# Patient Record
Sex: Female | Born: 1980 | Hispanic: Refuse to answer | Marital: Married | Smoking: Never smoker
Health system: Southern US, Community
[De-identification: ages and names within clinical notes are randomized; demographics above are authoritative.]

## PROBLEM LIST (undated history)

## (undated) DIAGNOSIS — F419 Anxiety disorder, unspecified: Secondary | ICD-10-CM

## (undated) DIAGNOSIS — T7840XA Allergy, unspecified, initial encounter: Secondary | ICD-10-CM

## (undated) DIAGNOSIS — K219 Gastro-esophageal reflux disease without esophagitis: Secondary | ICD-10-CM

## (undated) DIAGNOSIS — I10 Essential (primary) hypertension: Secondary | ICD-10-CM

## (undated) HISTORY — PX: NO PAST SURGERIES: SHX2092

## (undated) HISTORY — DX: Allergy, unspecified, initial encounter: T78.40XA

## (undated) HISTORY — DX: Gastro-esophageal reflux disease without esophagitis: K21.9

## (undated) HISTORY — DX: Anxiety disorder, unspecified: F41.9

---

## 2000-04-04 ENCOUNTER — Inpatient Hospital Stay (HOSPITAL_COMMUNITY): Admission: AD | Admit: 2000-04-04 | Discharge: 2000-04-04 | Payer: Self-pay | Admitting: Obstetrics & Gynecology

## 2000-05-29 ENCOUNTER — Other Ambulatory Visit: Admission: RE | Admit: 2000-05-29 | Discharge: 2000-05-29 | Payer: Self-pay | Admitting: *Deleted

## 2003-07-07 ENCOUNTER — Emergency Department (HOSPITAL_COMMUNITY): Admission: EM | Admit: 2003-07-07 | Discharge: 2003-07-08 | Payer: Self-pay | Admitting: Emergency Medicine

## 2005-11-12 ENCOUNTER — Emergency Department (HOSPITAL_COMMUNITY): Admission: EM | Admit: 2005-11-12 | Discharge: 2005-11-12 | Payer: Self-pay | Admitting: *Deleted

## 2009-07-10 ENCOUNTER — Emergency Department (HOSPITAL_COMMUNITY): Admission: EM | Admit: 2009-07-10 | Discharge: 2009-07-10 | Payer: Self-pay | Admitting: Family Medicine

## 2009-12-06 ENCOUNTER — Inpatient Hospital Stay (HOSPITAL_COMMUNITY): Admission: AD | Admit: 2009-12-06 | Discharge: 2009-12-06 | Payer: Self-pay | Admitting: Obstetrics & Gynecology

## 2009-12-06 ENCOUNTER — Ambulatory Visit: Payer: Self-pay | Admitting: Nurse Practitioner

## 2010-05-25 LAB — URINALYSIS, ROUTINE W REFLEX MICROSCOPIC
Glucose, UA: NEGATIVE mg/dL
Ketones, ur: NEGATIVE mg/dL
Nitrite: NEGATIVE
Protein, ur: NEGATIVE mg/dL
Urobilinogen, UA: 4 mg/dL — ABNORMAL HIGH (ref 0.0–1.0)

## 2010-05-25 LAB — POCT PREGNANCY, URINE: Preg Test, Ur: NEGATIVE

## 2011-05-21 ENCOUNTER — Encounter (HOSPITAL_COMMUNITY): Payer: Self-pay | Admitting: *Deleted

## 2011-05-21 ENCOUNTER — Emergency Department (HOSPITAL_COMMUNITY)
Admission: EM | Admit: 2011-05-21 | Discharge: 2011-05-21 | Disposition: A | Discharge status: Home Health | Payer: Self-pay | Attending: Emergency Medicine | Admitting: Emergency Medicine

## 2011-05-21 ENCOUNTER — Emergency Department (INDEPENDENT_AMBULATORY_CARE_PROVIDER_SITE_OTHER)
Admission: EM | Admit: 2011-05-21 | Discharge: 2011-05-21 | Disposition: A | Discharge status: Nursing Home (Includes ICF) | Payer: Self-pay | Source: Home / Self Care

## 2011-05-21 DIAGNOSIS — Z0389 Encounter for observation for other suspected diseases and conditions ruled out: Secondary | ICD-10-CM | POA: Insufficient documentation

## 2011-05-21 DIAGNOSIS — R109 Unspecified abdominal pain: Secondary | ICD-10-CM

## 2011-05-21 LAB — POCT URINALYSIS DIP (DEVICE)
Bilirubin Urine: NEGATIVE
Ketones, ur: NEGATIVE mg/dL
Leukocytes, UA: NEGATIVE
Nitrite: NEGATIVE
Protein, ur: NEGATIVE mg/dL
pH: 6 (ref 5.0–8.0)

## 2011-05-21 LAB — POCT PREGNANCY, URINE: Preg Test, Ur: NEGATIVE

## 2011-05-21 NOTE — ED Notes (Signed)
Pt  Reports   Pain  r  Upper  Quadrant    Radiating  aroiund  To  abd  With  Nausea  As  Well    Pt  reorts  Has  Had  Similar  Symptoms  sev  Weeks  Ago  Got  Worse  Today            Pt  Reports   Has  Taken  pepto  Bismol      For the  Symptoms      She  denys  Any  Vomiting/  Diarrhea           denys     Any  Vaginal  Bleeding or  Discharge           Sitting  Upright on  Exam table  In  No  Severe  Distress

## 2011-05-21 NOTE — ED Notes (Signed)
THE PT DID NOT WANT TO WAIT  ANY LONGER

## 2011-05-21 NOTE — ED Notes (Signed)
Pt states that she feels that she is here to help her be set up with a general practitioner.  No distress noted+

## 2011-05-21 NOTE — ED Provider Notes (Signed)
Medical screening examination/treatment/procedure(s) were performed by non-physician practitioner and as supervising physician I was immediately available for consultation/collaboration.  Alen Bleacher, MD 05/21/11 [phone]

## 2011-05-21 NOTE — ED Notes (Signed)
Pt woke up this am with generalized upper abdominal pain which she has felt in the past.  Pt states that she had nausea with this.  Pt states that the pain was intermittent and sharp.  Pt states that she was seen at [facility] for this and sent here for further evaluation.  Pt states that her aunt had similar symptoms and recently passed away from stomach cancer.

## 2011-05-21 NOTE — ED Provider Notes (Signed)
History     CSN: [id number]  Arrival date & time 05/21/11  1156   None     Chief Complaint  Patient presents with  . Abdominal Pain    (Consider location/radiation/quality/duration/timing/severity/associated sxs/prior treatment) HPI Comments: Patient presents today with complaints of upper abdominal pain and nausea that awoke her from sleep this morning. She has had pain episodes of abdominal pain with nausea and vomiting lasting one to 2 days over the last one year. She states earlier this morning the pain was a 6 on a scale of 1-10, it is currently a 3. No fever or diarrhea. She is urinating normally. She has not taken anything for her symptoms. Appetite is decreased. Patient is tearful and states that she is very concerned about cancer. She has had several family members including her mother who have passed away due sarcoma then spread to their abdomen. Most recently was an aunt who passed away 3 weeks ago. Patient is uninsured and has no primary care physician. She has not had these symptoms evaluated previously.   History reviewed. No pertinent past medical history.  History reviewed. No pertinent past surgical history.  History reviewed. No pertinent family history.  History  Substance Use Topics  . Smoking status: Not on file  . Smokeless tobacco: Not on file  . Alcohol Use: Not on file    OB History    Grav Para Term Preterm Abortions TAB SAB Ect Mult Living                  Review of Systems  Constitutional: Negative for fever and chills.  Respiratory: Negative for cough and shortness of breath.   Cardiovascular: Negative for chest pain.  Gastrointestinal: Positive for nausea, vomiting, abdominal pain and constipation (mild constipation). Negative for diarrhea.  Genitourinary: Negative for dysuria, frequency and decreased urine volume.    Allergies  Dust mite extract  Home Medications  No current outpatient prescriptions on file.  BP 172/100  Pulse 95   Temp(Src) 98.2 F (36.8 C) (Oral)  Resp 18  SpO2 100%  LMP 05/07/2011  Physical Exam  Constitutional: She appears well-developed and well-nourished. No distress.  HENT:  Head: Normocephalic and atraumatic.  Right Ear: Tympanic membrane, external ear and ear canal normal.  Left Ear: Tympanic membrane, external ear and ear canal normal.  Nose: Nose normal.  Mouth/Throat: Uvula is midline, oropharynx is clear and moist and mucous membranes are normal. No oropharyngeal exudate, posterior oropharyngeal edema or posterior oropharyngeal erythema.  Neck: Neck supple.  Cardiovascular: Normal rate, regular rhythm and normal heart sounds.   Pulmonary/Chest: Effort normal and breath sounds normal. No respiratory distress.  Abdominal: Soft. Normal appearance and bowel sounds are normal. She exhibits no mass. There is no hepatosplenomegaly. There is tenderness in the right upper quadrant and left upper quadrant. There is no rebound and no guarding.  Lymphadenopathy:    She has no cervical adenopathy.  Neurological: She is alert.  Skin: Skin is warm and dry.  Psychiatric:       Tearful.    ED Course  Procedures (including critical care time)   Labs Reviewed  POCT URINALYSIS DIP (DEVICE)  POCT PREGNANCY, URINE   No results found.   1. Abdominal pain       MDM  Patient transferred to the ED for further evaluation.        Melody Comas, [place] 05/21/11 1439

## 2012-04-02 ENCOUNTER — Emergency Department (HOSPITAL_COMMUNITY)
Admission: EM | Admit: 2012-04-02 | Discharge: 2012-04-03 | Disposition: A | Discharge status: Home / Self Care | Payer: Self-pay | Attending: Emergency Medicine | Admitting: Emergency Medicine

## 2012-04-02 DIAGNOSIS — G43119 Migraine with aura, intractable, without status migrainosus: Secondary | ICD-10-CM | POA: Insufficient documentation

## 2012-04-02 DIAGNOSIS — H53149 Visual discomfort, unspecified: Secondary | ICD-10-CM | POA: Insufficient documentation

## 2012-04-02 DIAGNOSIS — G43109 Migraine with aura, not intractable, without status migrainosus: Secondary | ICD-10-CM

## 2012-04-02 DIAGNOSIS — R42 Dizziness and giddiness: Secondary | ICD-10-CM | POA: Insufficient documentation

## 2012-04-02 NOTE — ED Notes (Signed)
Pt states that she has had intermittent eye pain over the last year and pressure in her R eye. States that the eye is sensitive to light. States that it happens about 1-2x a month and lasts for about a day.

## 2012-04-03 MED ORDER — TETRACAINE HCL 0.5 % OP SOLN
OPHTHALMIC | Status: AC
  Administered 2012-04-03: 2 [drp] via OPHTHALMIC
  Filled 2012-04-03: qty 2

## 2012-04-03 MED ORDER — TETRACAINE HCL 0.5 % OP SOLN
1.0000 [drp] | Freq: Once | OPHTHALMIC | Status: AC
  Administered 2012-04-03: 2 [drp] via OPHTHALMIC

## 2012-04-05 NOTE — ED Provider Notes (Signed)
Medical screening examination/treatment/procedure(s) were performed by non-physician practitioner and as supervising physician I was immediately available for consultation/collaboration.   Thamas Appleyard, MD 04/05/12 2356 

## 2012-04-05 NOTE — ED Provider Notes (Signed)
History     CSN: [id number]  Arrival date & time 04/02/12  2215   First MD Initiated Contact with Patient 04/02/12 2323      Chief Complaint  Patient presents with  . Eye Pain  . Pressure Behind the Eyes    (Consider location/radiation/quality/duration/timing/severity/associated sxs/prior treatment) HPI Comments: 32 y.o. female presents today complaining of frequent, gradual onset eye pressure for the last year. Patient rates severity 4/10. This pain is similar to other times she has felt this pain. Course is constant. Frequency is once a month, lasts about a day, then disappears. Interventions include benadryl which provides momentary relief. Pt admits photophobia, visual disturbances (describes seeing floaters), dizziness, and allergies to pet dander (pt owns a dog). Pressure does not radiate.  Denies fever, recent illness, trauma, headache, nausea/vomiting, jaw pain, numbness/tingling.       Patient is a 32 y.o. female presenting with eye pain.  Eye Pain Pertinent negatives include no chest pain, diaphoresis, fever, headaches, nausea, neck pain, numbness, rash, sore throat, vomiting or weakness.    No past medical history on file.  No past surgical history on file.  No family history on file.  History  Substance Use Topics  . Smoking status: Not on file  . Smokeless tobacco: Not on file  . Alcohol Use: No    OB History    Grav Para Term Preterm Abortions TAB SAB Ect Mult Living                  Review of Systems  Constitutional: Negative for fever and diaphoresis.  HENT: Negative for ear pain, sore throat, trouble swallowing, neck pain, neck stiffness, sinus pressure and tinnitus.        No jaw pain or trouble chewing  Eyes: Positive for photophobia, pain and visual disturbance.  Respiratory: Negative for apnea, chest tightness and shortness of breath.   Cardiovascular: Negative for chest pain and palpitations.  Gastrointestinal: Negative for nausea, vomiting,  diarrhea and constipation.  Genitourinary: Negative for dysuria and decreased urine volume.  Musculoskeletal: Negative for gait problem.  Skin: Negative for rash.  Neurological: Positive for dizziness. Negative for weakness, light-headedness, numbness and headaches.    Allergies  Other and Dust mite extract  Home Medications  No current outpatient prescriptions on file.  BP 150/107  Pulse 95  Temp 98.1 F (36.7 C) (Oral)  SpO2 97%  Physical Exam  Nursing note and vitals reviewed. Constitutional: She is oriented to person, place, and time. She appears well-developed and well-nourished. No distress.  HENT:  Head: Normocephalic and atraumatic. Head is without contusion.  Right Ear: Tympanic membrane normal.  Left Ear: Tympanic membrane normal.  Eyes: Conjunctivae normal and EOM are normal. Pupils are equal, round, and reactive to light.       Visual accuity normal using hand held snellen chart. Examined eye using slit lamp. No abnormalities. No hazy cornea. Tonometry readings 11, 14 and 14  Neck: Normal range of motion. Neck supple.       No meningeal signs  Cardiovascular: Normal rate, regular rhythm and normal heart sounds.  Exam reveals no gallop and no friction rub.   No murmur heard. Pulmonary/Chest: Effort normal and breath sounds normal. No respiratory distress. She has no wheezes. She has no rales. She exhibits no tenderness.  Abdominal: Soft. Bowel sounds are normal. She exhibits no distension. There is no tenderness. There is no rebound and no guarding.  Musculoskeletal: Normal range of motion. She exhibits no edema and no  tenderness.  Neurological: She is alert and oriented to person, place, and time. No cranial nerve deficit.  Skin: Skin is warm and dry. She is not diaphoretic. No erythema.    ED Course  Procedures (including critical care time)  Labs Reviewed - No data to display No results found.   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 Pt is afebrile with no focal neuro deficits, nuchal rigidity, or change in vision. Pt is to follow up with PCP to further evaluated if these are migraine auras without headache. Pt verbalizes understanding and is agreeable with plan to dc.   Glade Nurse, PA-C 04/05/12 2207

## 2013-10-23 ENCOUNTER — Encounter (HOSPITAL_COMMUNITY): Payer: Self-pay | Admitting: Emergency Medicine

## 2013-10-23 ENCOUNTER — Emergency Department (HOSPITAL_COMMUNITY)
Admission: EM | Admit: 2013-10-23 | Discharge: 2013-10-23 | Disposition: A | Discharge status: Home / Self Care | Payer: Commercial Managed Care - PPO | Attending: Emergency Medicine | Admitting: Emergency Medicine

## 2013-10-23 ENCOUNTER — Emergency Department (HOSPITAL_COMMUNITY): Payer: Commercial Managed Care - PPO

## 2013-10-23 DIAGNOSIS — S4980XA Other specified injuries of shoulder and upper arm, unspecified arm, initial encounter: Secondary | ICD-10-CM | POA: Diagnosis not present

## 2013-10-23 DIAGNOSIS — S99919A Unspecified injury of unspecified ankle, initial encounter: Principal | ICD-10-CM

## 2013-10-23 DIAGNOSIS — S46909A Unspecified injury of unspecified muscle, fascia and tendon at shoulder and upper arm level, unspecified arm, initial encounter: Secondary | ICD-10-CM | POA: Insufficient documentation

## 2013-10-23 DIAGNOSIS — S99929A Unspecified injury of unspecified foot, initial encounter: Principal | ICD-10-CM

## 2013-10-23 DIAGNOSIS — S8990XA Unspecified injury of unspecified lower leg, initial encounter: Secondary | ICD-10-CM | POA: Insufficient documentation

## 2013-10-23 DIAGNOSIS — Y9289 Other specified places as the place of occurrence of the external cause: Secondary | ICD-10-CM | POA: Diagnosis not present

## 2013-10-23 DIAGNOSIS — M25572 Pain in left ankle and joints of left foot: Secondary | ICD-10-CM

## 2013-10-23 DIAGNOSIS — M25511 Pain in right shoulder: Secondary | ICD-10-CM

## 2013-10-23 DIAGNOSIS — Y9389 Activity, other specified: Secondary | ICD-10-CM | POA: Insufficient documentation

## 2013-10-23 MED ORDER — METHOCARBAMOL 500 MG PO TABS: 500.0000 mg | ORAL_TABLET | Freq: Two times a day (BID) | ORAL | Status: DC

## 2013-10-23 MED ORDER — IBUPROFEN 800 MG PO TABS: 800.0000 mg | ORAL_TABLET | Freq: Three times a day (TID) | ORAL | Status: DC
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
## 2013-10-25 NOTE — ED Provider Notes (Signed)
Medical screening examination/treatment/procedure(s) were performed by non-physician practitioner and as supervising physician I was immediately available for consultation/collaboration.   EKG Interpretation None        Mirian MoMatthew Jep Dyas, MD 10/25/13 [phone]

## 2015-05-25 ENCOUNTER — Other Ambulatory Visit: Payer: Self-pay

## 2015-05-25 DIAGNOSIS — Z1231 Encounter for screening mammogram for malignant neoplasm of breast: Secondary | ICD-10-CM

## 2016-05-30 ENCOUNTER — Emergency Department (HOSPITAL_COMMUNITY): Payer: BLUE CROSS/BLUE SHIELD

## 2016-05-30 ENCOUNTER — Emergency Department (HOSPITAL_COMMUNITY)
Admission: EM | Admit: 2016-05-30 | Discharge: 2016-05-30 | Disposition: A | Discharge status: Home / Self Care | Payer: BLUE CROSS/BLUE SHIELD | Attending: Emergency Medicine | Admitting: Emergency Medicine

## 2016-05-30 ENCOUNTER — Encounter (HOSPITAL_COMMUNITY): Payer: Self-pay | Admitting: Emergency Medicine

## 2016-05-30 DIAGNOSIS — R112 Nausea with vomiting, unspecified: Secondary | ICD-10-CM | POA: Insufficient documentation

## 2016-05-30 DIAGNOSIS — Z79899 Other long term (current) drug therapy: Secondary | ICD-10-CM | POA: Insufficient documentation

## 2016-05-30 LAB — COMPREHENSIVE METABOLIC PANEL
ALT: 11 U/L — AB (ref 14–54)
AST: 17 U/L (ref 15–41)
Albumin: 4.1 g/dL (ref 3.5–5.0)
Alkaline Phosphatase: 80 U/L (ref 38–126)
Anion gap: 6 (ref 5–15)
BUN: 12 mg/dL (ref 6–20)
CHLORIDE: 110 mmol/L (ref 101–111)
CO2: 24 mmol/L (ref 22–32)
Calcium: 9.1 mg/dL (ref 8.9–10.3)
Creatinine, Ser: 0.67 mg/dL (ref 0.44–1.00)
Glucose, Bld: 99 mg/dL (ref 65–99)
POTASSIUM: 4 mmol/L (ref 3.5–5.1)
SODIUM: 140 mmol/L (ref 135–145)
Total Bilirubin: 0.4 mg/dL (ref 0.3–1.2)
Total Protein: 7.8 g/dL (ref 6.5–8.1)

## 2016-05-30 LAB — CBC
HEMATOCRIT: 35.6 % — AB (ref 36.0–46.0)
Hemoglobin: 12 g/dL (ref 12.0–15.0)
MCH: 26.7 pg (ref 26.0–34.0)
MCHC: 33.7 g/dL (ref 30.0–36.0)
MCV: 79.1 fL (ref 78.0–100.0)
Platelets: 465 10*3/uL — ABNORMAL HIGH (ref 150–400)
RBC: 4.5 MIL/uL (ref 3.87–5.11)
RDW: 14.9 % (ref 11.5–15.5)
WBC: 7.3 10*3/uL (ref 4.0–10.5)

## 2016-05-30 LAB — URINALYSIS, ROUTINE W REFLEX MICROSCOPIC
BACTERIA UA: NONE SEEN
BILIRUBIN URINE: NEGATIVE
Glucose, UA: NEGATIVE mg/dL
KETONES UR: NEGATIVE mg/dL
LEUKOCYTES UA: NEGATIVE
Nitrite: NEGATIVE
PH: 6 (ref 5.0–8.0)
PROTEIN: NEGATIVE mg/dL
Specific Gravity, Urine: 1.018 (ref 1.005–1.030)

## 2016-05-30 LAB — I-STAT BETA HCG BLOOD, ED (MC, WL, AP ONLY)

## 2016-05-30 LAB — LIPASE, BLOOD: LIPASE: 20 U/L (ref 11–51)

## 2016-05-30 MED ORDER — ONDANSETRON HCL 4 MG/2ML IJ SOLN
4.0000 mg | Freq: Once | INTRAMUSCULAR | Status: AC
  Administered 2016-05-30: 4 mg via INTRAVENOUS
  Filled 2016-05-30: qty 2

## 2016-05-30 MED ORDER — ONDANSETRON 4 MG PO TBDP: 4.0000 mg | ORAL_TABLET | Freq: Three times a day (TID) | ORAL | 0 refills | Status: DC | PRN

## 2016-05-30 MED ORDER — SODIUM CHLORIDE 0.9 % IV BOLUS (SEPSIS)
1000.0000 mL | Freq: Once | INTRAVENOUS | Status: AC
  Administered 2016-05-30: 1000 mL via INTRAVENOUS

## 2016-05-30 NOTE — ED Provider Notes (Signed)
[facility]-EMERGENCY DEPT Provider Note   CSN: [id number] Arrival date & time: 05/30/16  0847     History   Chief Complaint Chief Complaint  Patient presents with  . Abdominal Pain    HPI Alisha Beltran is a 36 y.o. female.  The history is provided by the patient.  Abdominal Pain   This is a new problem. The current episode started 3 to 5 hours ago. The problem occurs constantly. The problem has been gradually worsening (started in upper abd and then now has moved throughout abd). The pain is associated with an unknown factor. The pain is located in the generalized abdominal region. The quality of the pain is cramping and colicky. The pain is at a severity of 5/10. The pain is moderate. Associated symptoms include anorexia, nausea and vomiting. Pertinent negatives include diarrhea, dysuria and frequency. Nothing aggravates the symptoms. Nothing relieves the symptoms. Her past medical history does not include gallstones or GERD. Past medical history comments: no prior abd surgeries.    History reviewed. No pertinent past medical history.  There are no active problems to display for this patient.   History reviewed. No pertinent surgical history.  OB History    No data available       Home Medications    Prior to Admission medications   Medication Sig Start Date End Date Taking? Authorizing Provider  acetaminophen (TYLENOL) 500 MG tablet Take 1,000 mg by mouth every 6 (six) hours as needed for moderate pain.    Historical Provider, MD  ibuprofen (ADVIL,MOTRIN) 800 MG tablet Take 1 tablet (800 mg total) by mouth 3 (three) times daily. 10/23/13   Jennifer Piepenbrink, PA-C  methocarbamol (ROBAXIN) 500 MG tablet Take 1 tablet (500 mg total) by mouth 2 (two) times daily. 10/23/13   Jennifer Piepenbrink, PA-C  naproxen sodium (ANAPROX) 220 MG tablet Take 440 mg by mouth every 6 (six) hours as needed (pain).    Historical Provider, MD    Family History No family history on  file.  Social History Social History  Substance Use Topics  . Smoking status: Never Smoker  . Smokeless tobacco: Not on file  . Alcohol use No     Allergies   Other and Dust mite extract   Review of Systems Review of Systems  Gastrointestinal: Positive for abdominal pain, anorexia, nausea and vomiting. Negative for diarrhea.  Genitourinary: Negative for dysuria and frequency.  All other systems reviewed and are negative.    Physical Exam Updated Vital Signs BP (!) 171/126 (BP Location: Left Arm)   Pulse 62   Temp 97.7 F (36.5 C) (Oral)   Resp 18   LMP 05/30/2016   SpO2 100%   Physical Exam  Constitutional: She is oriented to person, place, and time. She appears well-developed and well-nourished. No distress.  HENT:  Head: Normocephalic and atraumatic.  Mouth/Throat: Oropharynx is clear and moist.  Eyes: Conjunctivae and EOM are normal. Pupils are equal, round, and reactive to light.  Neck: Normal range of motion. Neck supple.  Cardiovascular: Normal rate, regular rhythm and intact distal pulses.   No murmur heard. Pulmonary/Chest: Effort normal and breath sounds normal. No respiratory distress. She has no wheezes. She has no rales.  Abdominal: Soft. She exhibits no distension. There is tenderness. There is no rebound and no guarding.  Mild diffuse abd tenderness.  No rebound or guarding  Musculoskeletal: Normal range of motion. She exhibits no edema or tenderness.  Neurological: She is alert and oriented to person, place, and  time.  Skin: Skin is warm and dry. No rash noted. No erythema.  Psychiatric: She has a normal mood and affect. Her behavior is normal.  Nursing note and vitals reviewed.    ED Treatments / Results  Labs (all labs ordered are listed, but only abnormal results are displayed) Labs Reviewed  CBC - Abnormal; Notable for the following:       Result Value   HCT 35.6 (*)    Platelets 465 (*)    All other components within normal limits   LIPASE, BLOOD  COMPREHENSIVE METABOLIC PANEL  URINALYSIS, ROUTINE W REFLEX MICROSCOPIC  I-STAT BETA HCG BLOOD, ED ([facility], [facility], [facility] ONLY)    EKG  EKG Interpretation None       Radiology No results found.  Procedures Procedures (including critical care time)  Medications Ordered in ED Medications  sodium chloride 0.9 % bolus 1,000 mL (1,000 mLs Intravenous New Bag/Given 05/30/16 0935)  ondansetron (ZOFRAN) injection 4 mg (4 mg Intravenous Given 05/30/16 0936)     Initial Impression / Assessment and Plan / ED Course  I have reviewed the triage vital signs and the nursing notes.  Pertinent labs & imaging results that were available during my care of the patient were reviewed by me and considered in my medical decision making (see chart for details).    Patient presents today with diffuse abdominal pain and vomiting that started earlier this morning. Patient felt normal yesterday and normal when she went to bed last night. She has not had any diarrhea and denies any fever. There have been people out at her work from the GI bug. She denies any bad food exposure, antibiotics or recent travel. Vital signs other than hypertension are within normal limits. Patient has diffuse abdominal discomfort but no localized tenderness. She states the pain started in epigastric region and now has moved throughout her abdomen. The pain did seem to relieve after her episode of vomiting. She's had no prior abdominal surgeries at this time low suspicion for cholecystitis, pancreatitis, appendicitis or diverticulitis. She denies any urinary or vaginal symptoms. Patient given IV fluids and Zofran. Feel most likely this is viral in nature. Patient has a family member who had an abdominal tumor in the past and constipation and she is concerned about this. We'll do a KUB to ensure no evidence of obstruction or abnormality.  11:10 AM Labs within normal limits. Plain film with mild constipation but patient states she  normally only poops every 2-3 days. This is most likely her typical x-ray findings. No signs of obstruction. Patient is feeling much better after IV fluids and Zofran. We'll discharge home.  Final Clinical Impressions(s) / ED Diagnoses   Final diagnoses:  Intractable vomiting with nausea, unspecified vomiting type    New Prescriptions New Prescriptions   ONDANSETRON (ZOFRAN ODT) 4 MG DISINTEGRATING TABLET    Take 1 tablet (4 mg total) by mouth every 8 (eight) hours as needed for nausea or vomiting.     Gwyneth SproutWhitney Fatimah Sundquist, MD 05/30/16 1113

## 2016-05-30 NOTE — ED Notes (Signed)
Bed: WA13 Expected date:  Expected time:  Means of arrival:  Comments: Hold for triage 1 

## 2016-05-30 NOTE — ED Notes (Signed)
Discharge instructions, follow up care, and rx x1 reviewed with patient. Patient verbalized understanding. 

## 2016-05-30 NOTE — ED Triage Notes (Signed)
Per pt, states abdominal pain since this am-states she vomited bile once today

## 2016-05-30 NOTE — ED Notes (Signed)
Patient made aware of urine sample. Patient states she cannot void at this time. Patient encouraged to void when able. 

## 2017-01-27 ENCOUNTER — Encounter (HOSPITAL_COMMUNITY): Payer: Self-pay

## 2017-01-27 ENCOUNTER — Other Ambulatory Visit: Payer: Self-pay

## 2017-01-27 ENCOUNTER — Emergency Department (HOSPITAL_COMMUNITY)
Admission: EM | Admit: 2017-01-27 | Discharge: 2017-01-27 | Disposition: A | Discharge status: Home / Self Care | Payer: Commercial Managed Care - PPO | Attending: Emergency Medicine | Admitting: Emergency Medicine

## 2017-01-27 DIAGNOSIS — R030 Elevated blood-pressure reading, without diagnosis of hypertension: Secondary | ICD-10-CM

## 2017-01-27 DIAGNOSIS — N390 Urinary tract infection, site not specified: Secondary | ICD-10-CM

## 2017-01-27 DIAGNOSIS — Z79899 Other long term (current) drug therapy: Secondary | ICD-10-CM | POA: Insufficient documentation

## 2017-01-27 DIAGNOSIS — R1030 Lower abdominal pain, unspecified: Secondary | ICD-10-CM

## 2017-01-27 LAB — URINALYSIS, ROUTINE W REFLEX MICROSCOPIC
BILIRUBIN URINE: NEGATIVE
Glucose, UA: NEGATIVE mg/dL
Nitrite: POSITIVE — AB
Protein, ur: 100 mg/dL — AB
pH: 6 (ref 5.0–8.0)

## 2017-01-27 LAB — URINALYSIS, MICROSCOPIC (REFLEX)

## 2017-01-27 MED ORDER — CEPHALEXIN 500 MG PO CAPS: 500.0000 mg | ORAL_CAPSULE | Freq: Three times a day (TID) | ORAL | 0 refills | Status: AC

## 2017-01-27 MED ORDER — HYDROCHLOROTHIAZIDE 25 MG PO TABS: 25.0000 mg | ORAL_TABLET | Freq: Every day | ORAL | 0 refills | Status: DC

## 2017-01-27 MED ORDER — CEPHALEXIN 500 MG PO CAPS
500.0000 mg | ORAL_CAPSULE | Freq: Once | ORAL | Status: AC
  Administered 2017-01-27: 500 mg via ORAL
  Filled 2017-01-27: qty 1

## 2017-01-27 MED ORDER — HYDROCODONE-ACETAMINOPHEN 5-325 MG PO TABS: 1.0000 | ORAL_TABLET | ORAL | 0 refills | Status: DC | PRN

## 2017-01-27 NOTE — ED Provider Notes (Addendum)
[facility]-EMERGENCY DEPT Provider Note   CSN: [id number] Arrival date & time: 01/27/17  1546     History   Chief Complaint Chief Complaint  Patient presents with  . Abdominal Pain    HPI Alisha Beltran is a 36 y.o. female.  The history is provided by the patient and medical records. No language interpreter was used.  Dysuria   This is a recurrent problem. The current episode started more than 2 days ago. The problem occurs every urination. The problem has not changed since onset.The quality of the pain is described as burning. The pain is at a severity of 5/10. The pain is moderate. There has been no fever. Associated symptoms include frequency, hematuria and flank pain. Pertinent negatives include no chills, no nausea, no vomiting, no discharge and no urgency. She has tried nothing for the symptoms. Her past medical history does not include kidney stones or single kidney.    History reviewed. No pertinent past medical history.  There are no active problems to display for this patient.   History reviewed. No pertinent surgical history.  OB History    No data available       Home Medications    Prior to Admission medications   Medication Sig Start Date End Date Taking? Authorizing Provider  acetaminophen (TYLENOL) 500 MG tablet Take 500-1,000 mg by mouth every 6 (six) hours as needed for moderate pain.    Yes [provider]  naproxen sodium (ANAPROX) 220 MG tablet Take 220-440 mg by mouth every 6 (six) hours as needed (pain).    Yes [provider]  ondansetron (ZOFRAN ODT) 4 MG disintegrating tablet Take 1 tablet (4 mg total) by mouth every 8 (eight) hours as needed for nausea or vomiting. Patient not taking: Reported on 01/27/2017 05/30/16   Gwyneth Sprout, MD    Family History History reviewed. No pertinent family history.  Social History Social History   Tobacco Use  . Smoking status: Never Smoker  Substance Use  Topics  . Alcohol use: No  . Drug use: Not on file     Allergies   Other and Dust mite extract   Review of Systems Review of Systems  Constitutional: Negative for chills, diaphoresis, fatigue and fever.  HENT: Negative for congestion.   Eyes: Negative for visual disturbance.  Respiratory: Negative for cough, chest tightness, shortness of breath and wheezing.   Cardiovascular: Negative for chest pain.  Gastrointestinal: Negative for abdominal pain, constipation, diarrhea, nausea and vomiting.  Genitourinary: Positive for dysuria, flank pain, frequency and hematuria. Negative for urgency, vaginal bleeding, vaginal discharge and vaginal pain.  Musculoskeletal: Positive for back pain. Negative for neck pain and neck stiffness.  Skin: Negative for rash and wound.  Neurological: Negative for light-headedness and headaches.  Psychiatric/Behavioral: Negative for agitation and confusion.  All other systems reviewed and are negative.    Physical Exam Updated Vital Signs BP (!) 181/120   Pulse 76   Temp 98.8 F (37.1 C) (Oral)   Resp 14   SpO2 100%   Physical Exam  Constitutional: She is oriented to person, place, and time. She appears well-developed.  Non-toxic appearance. She does not appear ill. No distress.  HENT:  Head: Normocephalic and atraumatic.  Mouth/Throat: Oropharynx is clear and moist. No oropharyngeal exudate.  Eyes: EOM are normal. Pupils are equal, round, and reactive to light.  Neck: Normal range of motion.  Cardiovascular: Normal rate and intact distal pulses.  No murmur heard. Pulmonary/Chest: Effort normal  and breath sounds normal. No respiratory distress. She has no wheezes. She has no rales. She exhibits no tenderness.  Abdominal: Normal appearance and bowel sounds are normal. She exhibits no distension. There is tenderness in the suprapubic area. There is CVA tenderness (R). There is no rigidity and no rebound.    Musculoskeletal: She exhibits tenderness.        Back:  Neurological: She is alert and oriented to person, place, and time. No sensory deficit. She exhibits normal muscle tone.  Skin: Capillary refill takes less than 2 seconds. No rash noted. She is not diaphoretic. No erythema.  Psychiatric: She has a normal mood and affect.  Nursing note and vitals reviewed.    ED Treatments / Results  Labs (all labs ordered are listed, but only abnormal results are displayed) Labs Reviewed  URINALYSIS, ROUTINE W REFLEX MICROSCOPIC - Abnormal; Notable for the following components:      Result Value   Color, Urine AMBER (*)    APPearance CLOUDY (*)    Specific Gravity, Urine >1.030 (*)    Hgb urine dipstick LARGE (*)    Ketones, ur TRACE (*)    Protein, ur 100 (*)    Nitrite POSITIVE (*)    Leukocytes, UA MODERATE (*)    All other components within normal limits  URINALYSIS, MICROSCOPIC (REFLEX) - Abnormal; Notable for the following components:   Bacteria, UA MANY (*)    Squamous Epithelial / LPF 0-5 (*)    All other components within normal limits  POC URINE PREG, ED    EKG  EKG Interpretation None       Radiology No results found.  Procedures Procedures (including critical care time)  Medications Ordered in ED Medications  cephALEXin (KEFLEX) capsule 500 mg (500 mg Oral Given 01/27/17 2032)     Initial Impression / Assessment and Plan / ED Course  I have reviewed the triage vital signs and the nursing notes.  Pertinent labs & imaging results that were available during my care of the patient were reviewed by me and considered in my medical decision making (see chart for details).     Alisha Beltran is a 36 y.o. female with no significant past medical history who presents with dysuria, hematuria, foul-smelling urine, lower abdominal discomfort, and flank pains.  Patient reports that her symptoms of been ongoing for several weeks but acutely worsened over the last week.  She describes a mild to moderate discomfort and  aching in her lower abdomen.  She denies any vaginal discharge or vaginal bleeding.  She reports dysuria and has a foul-smelling urine.  Patient is concerned she has UTI which she has had in the past.  Patient denies any history of kidney stones.  She denies fevers, chills, nausea, vomiting, constipation, diarrhea, or other symptoms.  Patient has tried over-the-counter medications without significant success.  She denies any respiratory symptoms.  She did not want any pain medicine as she reports her symptoms are not too severe.  On exam, minimal lower abdominal tenderness.  No flank tenderness.  Minimal CVA tenderness.  Lungs clear.  Chest nontender.  No focal neurologic deficits.  Urinalysis revealed evidence of urinary tract infection.  There was evidence of hematuria.  Patient was advised that without laboratory testing or imaging, we could not rule out kidney stone or infected stone contributing to her UTI.  Patient reports that since her pain is minimal she would rather take antibiotics and go home to follow-up with a PCP.  Patient did  not want to stay for laboratory testing to check kidney function and electrolytes.  Patient given prescription for antibiotics and will follow up with her PCP.    Patient did not want to wait for pregnancy test either.    While being discharged, patient was found to be hypertensive.  This was repeated several times and confirmed.  Patient reports no history of hypertension but was willing to start HCTZ and follow-up with her PCP in several days.  Patient advised that if she had any new symptoms of hypertensive emergency or urgency shot, she should return to the emergency department.  Patient understood  Patient voiced understanding of return precautions as well as understanding the risks of missing infected stone.  Patient voiced understanding and will return.  Patient had no other worsens or concerns and was discharged in good condition.    Final Clinical  Impressions(s) / ED Diagnoses   Final diagnoses:  Lower urinary tract infectious disease  Elevated BP without diagnosis of hypertension  Lower abdominal pain    ED Discharge Orders        Ordered    cephALEXin (KEFLEX) 500 MG capsule  3 times daily     01/27/17 2033    HYDROcodone-acetaminophen (NORCO/VICODIN) 5-325 MG tablet  Every 4 hours PRN     01/27/17 2033    hydrochlorothiazide (HYDRODIURIL) 25 MG tablet  Daily     01/27/17 2119      Clinical Impression: 1. Lower urinary tract infectious disease   2. Elevated BP without diagnosis of hypertension   3. Lower abdominal pain     Disposition: Discharge  Condition: Good  I have discussed the results, Dx and Tx plan with the pt(& family if present). He/she/they expressed understanding and agree(s) with the plan. Discharge instructions discussed at great length. Strict return precautions discussed and pt &/or family have verbalized understanding of the instructions. No further questions at time of discharge.    This SmartLink is deprecated. Use AVSMEDLIST instead to display the medication list for a patient.  Follow Up: [facility] [address] [place] [id number] [phone] Schedule an appointment as soon as possible for a visit    [facility] [address] [id number] [place] [id number] [phone]  If symptoms worsen     Shanie Mauzy, Canary Brimhristopher J, MD 01/28/17 1352    Fate Galanti, Canary Brimhristopher J, MD 01/28/17 [phone]

## 2017-01-27 NOTE — Discharge Instructions (Signed)
You were found to have a urinary tract infection.  Please take the antibiotics to treat the infection.  As you were having tenderness and your flanks, we are giving you a longer course to treat if it is a pyelonephritis (affecting the kidneys).  Due to your discomfort, your being given a very short course of a stronger pain medicine if you need to use it.  Please follow-up with your primary care physician for reassessment and further management.  We found that you had an elevated blood pressure while you are getting ready for discharge.  I am going to start you on a very low-dose blood pressure medicine.  Please discuss this with your primary care physician when you see them.  If any symptoms change or worsen, please return to the nearest emergency department.

## 2017-01-27 NOTE — ED Triage Notes (Signed)
Pt c/o lower abdominal pain and lower back pain x2 days. She also reports malodorous urine. She thinks that she has a UTI. A&Ox4. Ambulatory.

## 2017-06-09 ENCOUNTER — Emergency Department (HOSPITAL_COMMUNITY): Payer: BLUE CROSS/BLUE SHIELD

## 2017-06-09 ENCOUNTER — Emergency Department (HOSPITAL_COMMUNITY)
Admission: EM | Admit: 2017-06-09 | Discharge: 2017-06-10 | Disposition: A | Discharge status: Home / Self Care | Payer: BLUE CROSS/BLUE SHIELD | Attending: Emergency Medicine | Admitting: Emergency Medicine

## 2017-06-09 ENCOUNTER — Encounter (HOSPITAL_COMMUNITY): Payer: Self-pay | Admitting: Emergency Medicine

## 2017-06-09 DIAGNOSIS — Z79899 Other long term (current) drug therapy: Secondary | ICD-10-CM | POA: Diagnosis not present

## 2017-06-09 DIAGNOSIS — Z9119 Patient's noncompliance with other medical treatment and regimen: Secondary | ICD-10-CM | POA: Diagnosis not present

## 2017-06-09 DIAGNOSIS — K029 Dental caries, unspecified: Secondary | ICD-10-CM | POA: Diagnosis not present

## 2017-06-09 DIAGNOSIS — Z9114 Patient's other noncompliance with medication regimen: Secondary | ICD-10-CM

## 2017-06-09 DIAGNOSIS — R079 Chest pain, unspecified: Secondary | ICD-10-CM | POA: Diagnosis not present

## 2017-06-09 DIAGNOSIS — K0889 Other specified disorders of teeth and supporting structures: Secondary | ICD-10-CM

## 2017-06-09 DIAGNOSIS — I10 Essential (primary) hypertension: Secondary | ICD-10-CM | POA: Diagnosis present

## 2017-06-09 LAB — CBC
HEMATOCRIT: 33.8 % — AB (ref 36.0–46.0)
Hemoglobin: 10.8 g/dL — ABNORMAL LOW (ref 12.0–15.0)
MCH: 25.8 pg — ABNORMAL LOW (ref 26.0–34.0)
MCHC: 32 g/dL (ref 30.0–36.0)
MCV: 80.7 fL (ref 78.0–100.0)
Platelets: 483 10*3/uL — ABNORMAL HIGH (ref 150–400)
RBC: 4.19 MIL/uL (ref 3.87–5.11)
RDW: 15.3 % (ref 11.5–15.5)
WBC: 8.7 10*3/uL (ref 4.0–10.5)

## 2017-06-09 LAB — BASIC METABOLIC PANEL
Anion gap: 8 (ref 5–15)
BUN: 7 mg/dL (ref 6–20)
CHLORIDE: 107 mmol/L (ref 101–111)
CO2: 23 mmol/L (ref 22–32)
Calcium: 8.9 mg/dL (ref 8.9–10.3)
Creatinine, Ser: 0.78 mg/dL (ref 0.44–1.00)
GFR calc Af Amer: >60 mL/min (ref >60)
GFR calc non Af Amer: >60 mL/min (ref >60)
GLUCOSE: 92 mg/dL (ref 65–99)
POTASSIUM: 3.9 mmol/L (ref 3.5–5.1)
Sodium: 138 mmol/L (ref 135–145)

## 2017-06-09 LAB — I-STAT BETA HCG BLOOD, ED (MC, WL, AP ONLY)

## 2017-06-09 LAB — I-STAT TROPONIN, ED: Troponin i, poc: 0 ng/mL (ref 0.00–0.08)

## 2017-06-09 MED ORDER — OXYCODONE-ACETAMINOPHEN 5-325 MG PO TABS
1.0000 | ORAL_TABLET | ORAL | Status: DC | PRN
  Administered 2017-06-09: 1 via ORAL
  Filled 2017-06-09: qty 1

## 2017-06-09 NOTE — ED Triage Notes (Signed)
Pt presents with CP where she describes a single event in which there was a tightness in her chest that also caused some difficulty breathing and feeling lightheaded and weak; pt reports this as a transient episode; pt denies having ever had this happen to her before; Pt also states she is concerned about dental pain the R upper and lower jaw x 1 week, swelling and redness noted BP also noted to be elevated in triage, hx of same with non-compliance

## 2017-06-10 LAB — I-STAT TROPONIN, ED: Troponin i, poc: 0 ng/mL (ref 0.00–0.08)

## 2017-06-10 MED ORDER — AMLODIPINE BESYLATE 2.5 MG PO TABS: 2.5000 mg | ORAL_TABLET | Freq: Every day | ORAL | 0 refills | Status: DC

## 2017-06-10 MED ORDER — PENICILLIN V POTASSIUM 500 MG PO TABS: 500.0000 mg | ORAL_TABLET | Freq: Four times a day (QID) | ORAL | 0 refills | Status: AC

## 2017-06-10 MED ORDER — DICLOFENAC SODIUM ER 100 MG PO TB24: 100.0000 mg | ORAL_TABLET | Freq: Every day | ORAL | 0 refills | Status: DC

## 2017-06-10 MED ORDER — OMEPRAZOLE 20 MG PO CPDR: 20.0000 mg | DELAYED_RELEASE_CAPSULE | Freq: Every day | ORAL | 0 refills | Status: DC

## 2017-06-10 MED ORDER — PENICILLIN V POTASSIUM 250 MG PO TABS
500.0000 mg | ORAL_TABLET | Freq: Once | ORAL | Status: AC
  Administered 2017-06-10: 500 mg via ORAL
  Filled 2017-06-10: qty 2

## 2017-06-10 MED ORDER — GI COCKTAIL ~~LOC~~
30.0000 mL | Freq: Once | ORAL | Status: AC
  Administered 2017-06-10: 30 mL via ORAL
  Filled 2017-06-10: qty 30

## 2017-06-10 MED ORDER — AMLODIPINE BESYLATE 5 MG PO TABS
5.0000 mg | ORAL_TABLET | Freq: Once | ORAL | Status: AC
  Administered 2017-06-10: 5 mg via ORAL
  Filled 2017-06-10: qty 1

## 2017-06-10 NOTE — ED Provider Notes (Signed)
[facility] EMERGENCY DEPARTMENT Provider Note   CSN: [id number] Arrival date & time: 06/09/17  1900     History   Chief Complaint Chief Complaint  Patient presents with  . Hypertension  . Dental Pain  . Chest Pain    HPI Alisha Beltran is a 37 y.o. female.  The history is provided by the patient.  Hypertension  This is a chronic problem. The current episode started more than 1 week ago. The problem occurs constantly. The problem has been gradually worsening. Associated symptoms include chest pain. Pertinent negatives include no abdominal pain, no headaches and no shortness of breath. Nothing aggravates the symptoms. Nothing relieves the symptoms. She has tried nothing for the symptoms. The treatment provided no relief.  Dental Pain   This is a new problem. The current episode started more than 1 week ago. The problem occurs constantly. The problem has not changed since onset.The pain is at a severity of 10/10. The pain is severe. She has tried acetaminophen for the symptoms. The treatment provided no relief.  Chest Pain   This is a new problem. The current episode started 3 to 5 hours ago. The problem occurs constantly. The problem has been resolved. The pain is associated with rest. The pain is present in the substernal region. The pain is moderate. The quality of the pain is described as pressure-like. Duration of episode(s) is 45 minutes. Pertinent negatives include no abdominal pain, no diaphoresis, no dizziness, no exertional chest pressure, no headaches, no hemoptysis, no irregular heartbeat, no leg pain, no lower extremity edema, no palpitations, no shortness of breath, no sputum production, no syncope and no vomiting. She has tried nothing for the symptoms. The treatment provided no relief. Risk factors include obesity.  Her past medical history is significant for hypertension.  Pertinent negatives for past medical history include no aneurysm and no MI.    Pertinent negatives for family medical history include: no Marfan's syndrome.  Procedure history is negative for cardiac catheterization.  Thinks chest pain is from NSAIDs she has been taking for dental pain.  Has not taken her BP meds in a long time.    History reviewed. No pertinent past medical history.  There are no active problems to display for this patient.   History reviewed. No pertinent surgical history.   OB History   None      Home Medications    Prior to Admission medications   Medication Sig Start Date End Date Taking? Authorizing Provider  acetaminophen (TYLENOL) 500 MG tablet Take 500-1,000 mg by mouth every 6 (six) hours as needed for moderate pain.     [provider]  hydrochlorothiazide (HYDRODIURIL) 25 MG tablet Take 1 tablet (25 mg total) daily by mouth. 01/27/17   Tegeler, Canary Brim, MD  HYDROcodone-acetaminophen (NORCO/VICODIN) 5-325 MG tablet Take 1 tablet every 4 (four) hours as needed by mouth for severe pain. 01/27/17   Tegeler, Canary Brim, MD  naproxen sodium (ANAPROX) 220 MG tablet Take 220-440 mg by mouth every 6 (six) hours as needed (pain).     [provider]  ondansetron (ZOFRAN ODT) 4 MG disintegrating tablet Take 1 tablet (4 mg total) by mouth every 8 (eight) hours as needed for nausea or vomiting. Patient not taking: Reported on 01/27/2017 05/30/16   Gwyneth Sprout, MD    Family History History reviewed. No pertinent family history.  Social History Social History   Tobacco Use  . Smoking status: Never Smoker  Substance Use Topics  .  Alcohol use: No  . Drug use: Not on file     Allergies   Other and Dust mite extract   Review of Systems Review of Systems  Constitutional: Negative for diaphoresis.  HENT: Positive for dental problem. Negative for drooling and facial swelling.   Eyes: Negative for photophobia.  Respiratory: Negative for hemoptysis, sputum production and shortness of breath.    Cardiovascular: Positive for chest pain. Negative for palpitations, leg swelling and syncope.  Gastrointestinal: Negative for abdominal pain and vomiting.  Neurological: Negative for dizziness and headaches.  All other systems reviewed and are negative.    Physical Exam Updated Vital Signs BP (!) 184/109   Pulse 61   Temp 98.6 F (37 C) (Oral)   Resp (!) 21   Ht 5\' 5"  (1.651 m)   Wt 99.8 kg (220 lb)   LMP 06/02/2017   SpO2 99%   BMI 36.61 kg/m   Physical Exam  Constitutional: She is oriented to person, place, and time. She appears well-developed and well-nourished. No distress.  HENT:  Head: Normocephalic and atraumatic.  Mouth/Throat: Oropharynx is clear and moist. No trismus in the jaw. Dental caries present. No oropharyngeal exudate.    There is no swelling or redness of the face jaw or neck  Eyes: Pupils are equal, round, and reactive to light. Conjunctivae are normal.  Neck: Normal range of motion. Neck supple. No tracheal deviation present.  Cardiovascular: Normal rate, regular rhythm, normal heart sounds and intact distal pulses.  Pulmonary/Chest: Effort normal and breath sounds normal. No stridor. No respiratory distress. She has no wheezes. She has no rales.  Abdominal: Soft. Bowel sounds are normal. She exhibits no mass. There is no tenderness. There is no rebound and no guarding.  Musculoskeletal: Normal range of motion.  Neurological: She is alert and oriented to person, place, and time. She displays normal reflexes. No cranial nerve deficit or sensory deficit. She exhibits normal muscle tone. Coordination normal.  Skin: Skin is warm and dry. Capillary refill takes less than 2 seconds.  Psychiatric: She has a normal mood and affect.  Nursing note and vitals reviewed.    ED Treatments / Results  Labs (all labs ordered are listed, but only abnormal results are displayed)  Results for orders placed or performed during the hospital encounter of 06/09/17  Basic  metabolic panel  Result Value Ref Range   Sodium 138 135 - 145 mmol/L   Potassium 3.9 3.5 - 5.1 mmol/L   Chloride 107 101 - 111 mmol/L   CO2 23 22 - 32 mmol/L   Glucose, Bld 92 65 - 99 mg/dL   BUN 7 6 - 20 mg/dL   Creatinine, Ser 1.610.78 0.44 - 1.00 mg/dL   Calcium 8.9 8.9 - 09.610.3 mg/dL   GFR calc non Af Amer >60 >60 mL/min   GFR calc Af Amer >60 >60 mL/min   Anion gap 8 5 - 15  CBC  Result Value Ref Range   WBC 8.7 4.0 - 10.5 K/uL   RBC 4.19 3.87 - 5.11 MIL/uL   Hemoglobin 10.8 (L) 12.0 - 15.0 g/dL   HCT 04.533.8 (L) 40.936.0 - 81.146.0 %   MCV 80.7 78.0 - 100.0 fL   MCH 25.8 (L) 26.0 - 34.0 pg   MCHC 32.0 30.0 - 36.0 g/dL   RDW 91.415.3 78.211.5 - 95.615.5 %   Platelets 483 (H) 150 - 400 K/uL  I-stat troponin, ED  Result Value Ref Range   Troponin i, poc 0.00 0.00 -  0.08 ng/mL   Comment 3          I-Stat beta hCG blood, ED  Result Value Ref Range   I-stat hCG, quantitative <5.0 <5 mIU/mL   Comment 3          I-stat troponin, ED  Result Value Ref Range   Troponin i, poc 0.00 0.00 - 0.08 ng/mL   Comment 3           Dg Chest 2 View  Result Date: 06/09/2017 CLINICAL DATA:  Chest pain. EXAM: CHEST - 2 VIEW COMPARISON:  None. FINDINGS: The heart size and mediastinal contours are within normal limits. Both lungs are clear. No pneumothorax or pleural effusion is noted. The visualized skeletal structures are unremarkable. IMPRESSION: No active cardiopulmonary disease. Electronically Signed   By: Lupita Raider, M.D.   On: 06/09/2017 20:24    EKG EKG Interpretation  Date/Time:  Sunday June 09 2017 19:04:34 EDT Ventricular Rate:  86 PR Interval:  112 QRS Duration: 86 QT Interval:  378 QTC Calculation: 452 R Axis:   68 Text Interpretation:  Normal sinus rhythm Confirmed by Nicanor Alcon, Nakya Weyand ([id number]) on 06/10/2017 12:45:36 AM   Radiology Dg Chest 2 View  Result Date: 06/09/2017 CLINICAL DATA:  Chest pain. EXAM: CHEST - 2 VIEW COMPARISON:  None. FINDINGS: The heart size and mediastinal contours are  within normal limits. Both lungs are clear. No pneumothorax or pleural effusion is noted. The visualized skeletal structures are unremarkable. IMPRESSION: No active cardiopulmonary disease. Electronically Signed   By: Lupita Raider, M.D.   On: 06/09/2017 20:24    Procedures Procedures (including critical care time)  Medications Ordered in ED Medications  oxyCODONE-acetaminophen (PERCOCET/ROXICET) 5-325 MG per tablet 1 tablet (1 tablet Oral Given 06/09/17 1931)  amLODipine (NORVASC) tablet 5 mg (5 mg Oral Given 06/10/17 0058)  gi cocktail (Maalox,Lidocaine,Donnatal) (30 mLs Oral Given 06/10/17 0059)  penicillin v potassium (VEETID) tablet 500 mg (500 mg Oral Given 06/10/17 0058)     PERC negative wells 0 highly doubt PE in this very low risk patient.  HEART score is 1 ruled out for MI in the ED> very low risk for MACE.  Suspect gastritis secondary to NSAID use  Final Clinical Impressions(s) / ED Diagnoses   Will start voltaren.  Stop all other NSAIDs, patient verbalizes understanding.  Will start PCN.  Resource guide give for dentists and PMD.  Will start HCTZ.    Return for weakness, numbness, changes in vision or speech, fevers >100.4 unrelieved by medication, shortness of breath, intractable vomiting, or diarrhea, abdominal pain, Inability to tolerate liquids or food, cough, altered mental status or any concerns. No signs of systemic illness or infection. The patient is nontoxic-appearing on exam and vital signs are within normal limits.   I have reviewed the triage vital signs and the nursing notes. Pertinent labs &imaging results that were available during my care of the patient were reviewed by me and considered in my medical decision making (see chart for details).  After history, exam, and medical workup I feel the patient has been appropriately medically screened and is safe for discharge home. Pertinent diagnoses were discussed with the patient. Patient was given return  precautions.        Johanna Matto, MD 06/10/17 [phone]

## 2017-06-10 NOTE — ED Notes (Signed)
Patient's husband not pleased with prescriptions given. Wants to know why Prilosec was prescribed when she's having chest pain. States "if we go home and something happens to her all hell is going to break loose". He was asked multiple times if he would like to speak with the MD and he refused states "no we are going home, I don't want to speak to her".

## 2017-08-14 DIAGNOSIS — I10 Essential (primary) hypertension: Secondary | ICD-10-CM | POA: Insufficient documentation

## 2018-04-18 ENCOUNTER — Ambulatory Visit: Payer: BLUE CROSS/BLUE SHIELD | Admitting: Family Medicine

## 2018-04-18 DIAGNOSIS — Z0289 Encounter for other administrative examinations: Secondary | ICD-10-CM

## 2018-05-13 ENCOUNTER — Encounter: Payer: Self-pay | Admitting: Family Medicine

## 2019-11-21 ENCOUNTER — Ambulatory Visit (HOSPITAL_COMMUNITY)
Admission: EM | Admit: 2019-11-21 | Discharge: 2019-11-21 | Disposition: A | Discharge status: Home / Self Care | Payer: 59 | Attending: Urgent Care | Admitting: Urgent Care

## 2019-11-21 ENCOUNTER — Other Ambulatory Visit: Payer: Self-pay

## 2019-11-21 ENCOUNTER — Encounter (HOSPITAL_COMMUNITY): Payer: Self-pay

## 2019-11-21 DIAGNOSIS — H9201 Otalgia, right ear: Secondary | ICD-10-CM

## 2019-11-21 DIAGNOSIS — H938X1 Other specified disorders of right ear: Secondary | ICD-10-CM

## 2019-11-21 DIAGNOSIS — R030 Elevated blood-pressure reading, without diagnosis of hypertension: Secondary | ICD-10-CM

## 2019-11-21 DIAGNOSIS — I10 Essential (primary) hypertension: Secondary | ICD-10-CM

## 2019-11-21 DIAGNOSIS — R42 Dizziness and giddiness: Secondary | ICD-10-CM

## 2019-11-21 HISTORY — DX: Essential (primary) hypertension: I10

## 2019-11-21 MED ORDER — PSEUDOEPHEDRINE HCL 60 MG PO TABS: 60.0000 mg | ORAL_TABLET | Freq: Three times a day (TID) | ORAL | 0 refills | Status: DC | PRN

## 2019-11-21 MED ORDER — CETIRIZINE HCL 10 MG PO TABS: 10.0000 mg | ORAL_TABLET | Freq: Every day | ORAL | 0 refills | Status: DC

## 2019-11-21 MED ORDER — AMLODIPINE BESYLATE 5 MG PO TABS: 5.0000 mg | ORAL_TABLET | Freq: Every day | ORAL | 0 refills | Status: DC

## 2019-11-21 MED ORDER — AMOXICILLIN 875 MG PO TABS: 875.0000 mg | ORAL_TABLET | Freq: Two times a day (BID) | ORAL | 0 refills | Status: DC

## 2019-11-21 NOTE — ED Provider Notes (Signed)
[facility]   MRN: [id number] DOB: 12-12-1980  Subjective:   Alisha Beltran is a 39 y.o. female presenting for 3-day history of acute onset right ear pain, fullness and dizziness.  Denies fever, nausea, vomiting, runny or stuffy nose, sore throat, cough, chest pain, shortness of breath.  Patient would also like recommendations for PCP, wants to have her blood pressure medication refill.  She is trying to eat a lot healthier.  No current facility-administered medications for this encounter.  Current Outpatient Medications:  .  acetaminophen (TYLENOL) 500 MG tablet, Take 500-1,000 mg by mouth every 6 (six) hours as needed for moderate pain. , Disp: , Rfl:  .  amLODipine (NORVASC) 2.5 MG tablet, Take 1 tablet (2.5 mg total) by mouth daily., Disp: 30 tablet, Rfl: 0 .  Diclofenac Sodium CR (VOLTAREN-XR) 100 MG 24 hr tablet, Take 1 tablet (100 mg total) by mouth daily., Disp: 7 tablet, Rfl: 0 .  hydrochlorothiazide (HYDRODIURIL) 25 MG tablet, Take 1 tablet (25 mg total) daily by mouth., Disp: 21 tablet, Rfl: 0 .  HYDROcodone-acetaminophen (NORCO/VICODIN) 5-325 MG tablet, Take 1 tablet every 4 (four) hours as needed by mouth for severe pain., Disp: 8 tablet, Rfl: 0 .  naproxen sodium (ANAPROX) 220 MG tablet, Take 220-440 mg by mouth every 6 (six) hours as needed (pain). , Disp: , Rfl:  .  omeprazole (PRILOSEC) 20 MG capsule, Take 1 capsule (20 mg total) by mouth daily., Disp: 30 capsule, Rfl: 0 .  ondansetron (ZOFRAN ODT) 4 MG disintegrating tablet, Take 1 tablet (4 mg total) by mouth every 8 (eight) hours as needed for nausea or vomiting. (Patient not taking: Reported on 01/27/2017), Disp: 15 tablet, Rfl: 0   Allergies  Allergen Reactions  . Other Hives and Itching    Pet Dander  . Dust Mite Extract     Coughing     Past Medical History:  Diagnosis Date  . Hypertension      History reviewed. No pertinent surgical history.  No family history on file.  Social History    Tobacco Use  . Smoking status: Never Smoker  Substance Use Topics  . Alcohol use: No  . Drug use: Never    ROS   Objective:   Vitals: BP (!) 153/117   Pulse (!) 113   Temp 98.3 F (36.8 C) (Oral)   Resp 20   Ht 5\' 5"  (1.651 m)   Wt 293 lb (132.9 kg)   SpO2 100%   BMI 48.76 kg/m   Physical Exam Constitutional:      General: She is not in acute distress.    Appearance: Normal appearance. She is well-developed. She is not ill-appearing, toxic-appearing or diaphoretic.  HENT:     Head: Normocephalic and atraumatic.     Right Ear: Ear canal and external ear normal. There is no impacted cerumen.     Left Ear: Tympanic membrane, ear canal and external ear normal. There is no impacted cerumen.     Ears:     Comments: Right TM opacified with an effusion.    Nose: Nose normal.     Mouth/Throat:     Mouth: Mucous membranes are moist.     Pharynx: Oropharynx is clear.  Eyes:     General: No scleral icterus.       Right eye: No discharge.        Left eye: No discharge.     Extraocular Movements: Extraocular movements intact.     Conjunctiva/sclera:  Conjunctivae normal.     Pupils: Pupils are equal, round, and reactive to light.  Skin:    General: Skin is warm and dry.  Neurological:     General: No focal deficit present.     Mental Status: She is alert and oriented to person, place, and time.  Psychiatric:        Mood and Affect: Mood normal.        Behavior: Behavior normal.        Thought Content: Thought content normal.        Judgment: Judgment normal.      Assessment and Plan :   PDMP not reviewed this encounter.  1. Right ear pain   2. Dizziness   3. Ear fullness, right   4. Essential hypertension   5. Elevated blood pressure reading     Will cover for infectious process with amoxicillin but recommended she also use Zyrtec and Sudafed for supportive care.  I refilled her blood pressure medication of amlodipine 5 mg once daily.  Recommended she  follow-up with a couple of providers to establish care as her new PCP. Counseled patient on potential for adverse effects with medications prescribed/recommended today, ER and return-to-clinic precautions discussed, patient verbalized understanding.    Wallis Bamberg, PA-C 11/22/19 1013

## 2019-11-21 NOTE — ED Triage Notes (Signed)
Pt c/o dizziness and her right ear feels clogged and she has a ringing in that ear and can hardly hear out of that earx3 days. Pt states all of these symptoms started the same day and at the same time.

## 2019-11-21 NOTE — Discharge Instructions (Addendum)

## 2019-12-30 ENCOUNTER — Ambulatory Visit (INDEPENDENT_AMBULATORY_CARE_PROVIDER_SITE_OTHER): Payer: 59 | Admitting: Family Medicine

## 2019-12-30 ENCOUNTER — Other Ambulatory Visit: Payer: Self-pay

## 2019-12-30 ENCOUNTER — Encounter: Payer: Self-pay | Admitting: Family Medicine

## 2019-12-30 VITALS — BP 132/88 | HR 97 | Temp 97.0°F | Ht 64.0 in | Wt 306.2 lb

## 2019-12-30 DIAGNOSIS — K5904 Chronic idiopathic constipation: Secondary | ICD-10-CM | POA: Insufficient documentation

## 2019-12-30 DIAGNOSIS — Z6841 Body Mass Index (BMI) 40.0 and over, adult: Secondary | ICD-10-CM

## 2019-12-30 DIAGNOSIS — I10 Essential (primary) hypertension: Secondary | ICD-10-CM | POA: Diagnosis not present

## 2019-12-30 DIAGNOSIS — Z0001 Encounter for general adult medical examination with abnormal findings: Secondary | ICD-10-CM

## 2019-12-30 DIAGNOSIS — E559 Vitamin D deficiency, unspecified: Secondary | ICD-10-CM

## 2019-12-30 DIAGNOSIS — Z124 Encounter for screening for malignant neoplasm of cervix: Secondary | ICD-10-CM

## 2019-12-30 DIAGNOSIS — J302 Other seasonal allergic rhinitis: Secondary | ICD-10-CM | POA: Diagnosis not present

## 2019-12-30 DIAGNOSIS — Z1159 Encounter for screening for other viral diseases: Secondary | ICD-10-CM

## 2019-12-30 DIAGNOSIS — Z0000 Encounter for general adult medical examination without abnormal findings: Secondary | ICD-10-CM

## 2019-12-30 DIAGNOSIS — Z114 Encounter for screening for human immunodeficiency virus [HIV]: Secondary | ICD-10-CM

## 2019-12-30 LAB — COMPREHENSIVE METABOLIC PANEL
ALT: 6 U/L (ref 0–35)
AST: 12 U/L (ref 0–37)
Albumin: 3.8 g/dL (ref 3.5–5.2)
Alkaline Phosphatase: 68 U/L (ref 39–117)
BUN: 9 mg/dL (ref 6–23)
CO2: 28 mEq/L (ref 19–32)
Calcium: 9 mg/dL (ref 8.4–10.5)
Chloride: 105 mEq/L (ref 96–112)
Creatinine, Ser: 0.74 mg/dL (ref 0.40–1.20)
GFR: 101.2 mL/min (ref >60.00)
Glucose, Bld: 86 mg/dL (ref 70–99)
Potassium: 4.3 mEq/L (ref 3.5–5.1)
Sodium: 137 mEq/L (ref 135–145)
Total Bilirubin: 0.4 mg/dL (ref 0.2–1.2)
Total Protein: 6.7 g/dL (ref 6.0–8.3)

## 2019-12-30 LAB — LIPID PANEL
Cholesterol: 132 mg/dL (ref 0–200)
HDL: 40.1 mg/dL (ref >39.00)
LDL Cholesterol: 76 mg/dL (ref 0–99)
NonHDL: 92.3
Total CHOL/HDL Ratio: 3
Triglycerides: 83 mg/dL (ref 0.0–149.0)
VLDL: 16.6 mg/dL (ref 0.0–40.0)

## 2019-12-30 LAB — CBC WITH DIFFERENTIAL/PLATELET
Basophils Absolute: 0.1 10*3/uL (ref 0.0–0.1)
Basophils Relative: 1 % (ref 0.0–3.0)
Eosinophils Absolute: 0.1 10*3/uL (ref 0.0–0.7)
Eosinophils Relative: 1.1 % (ref 0.0–5.0)
HCT: 35.6 % — ABNORMAL LOW (ref 36.0–46.0)
Hemoglobin: 11.7 g/dL — ABNORMAL LOW (ref 12.0–15.0)
Lymphocytes Relative: 31.4 % (ref 12.0–46.0)
Lymphs Abs: 2.5 10*3/uL (ref 0.7–4.0)
MCHC: 32.8 g/dL (ref 30.0–36.0)
MCV: 79.4 fl (ref 78.0–100.0)
Monocytes Absolute: 0.5 10*3/uL (ref 0.1–1.0)
Monocytes Relative: 6.5 % (ref 3.0–12.0)
Neutro Abs: 4.7 10*3/uL (ref 1.4–7.7)
Neutrophils Relative %: 60 % (ref 43.0–77.0)
Platelets: 436 10*3/uL — ABNORMAL HIGH (ref 150.0–400.0)
RBC: 4.48 Mil/uL (ref 3.87–5.11)
RDW: 16.6 % — ABNORMAL HIGH (ref 11.5–15.5)
WBC: 7.9 10*3/uL (ref 4.0–10.5)

## 2019-12-30 LAB — TSH: TSH: 4 u[IU]/mL (ref 0.35–4.50)

## 2019-12-30 LAB — VITAMIN D 25 HYDROXY (VIT D DEFICIENCY, FRACTURES): VITD: <7 ng/mL — ABNORMAL LOW (ref 30.00–100.00)

## 2019-12-30 MED ORDER — HYDROCHLOROTHIAZIDE 25 MG PO TABS: 25.0000 mg | ORAL_TABLET | Freq: Every day | ORAL | 3 refills | Status: DC

## 2019-12-30 MED ORDER — FLUTICASONE PROPIONATE 50 MCG/ACT NA SUSP: 2.0000 | Freq: Every day | NASAL | 6 refills | Status: DC

## 2019-12-30 MED ORDER — CETIRIZINE HCL 10 MG PO TABS: 10.0000 mg | ORAL_TABLET | Freq: Every day | ORAL | 2 refills | Status: DC

## 2019-12-30 NOTE — Patient Instructions (Addendum)
Please call and schedule an appointment for screening mammogram. A referral is not needed.  [phone]   I will send you a message about your labs via mychart  Recheck blood pressure was good today! I have sent in HCTZ for you to take daily, let me know if any side effects. Follow up in 4 weeks for recheck of blood pressure and blood work to check your electrolytes.   Please take a dose of miralax every night, increase your water and fiber in the form of non starchy vegetables.   There is not one right eating plan for everyone.  It may take trial and error to find what will work for you.  It is important to get adequate protein and fiber with your meals.  It is okay to not eat breakfast or to skip meals if you are not hungry.  Avoid snacking between meals.  Unless you are on a fluid restriction, drink 80 to 90 ounces of water a day.  Suggested resources- www.dietdoctor.com/diabetes/diet www.adaptyourlifeacademy.com-there is a quiz to help you determine how many carbohydrates you should eat a day  www.thefastingmethod.com   Here are some guidelines to help you with meal planning -  Avoid all processed and packaged foods (bread, pasta, crackers, chips, etc) and beverages containing calories.  Avoid added sugars and excessive natural sugars.  Pay attention to how you feel if you consume artificial sweeteners.  Do they make you more hungry or raise your blood sugar?  With every meal and snack, aim to get 20 g of protein (3 ounces of meat, 4 ounces of fish, 3 eggs, protein powder, 1 cup [place] yogurt, 1 cup cottage cheese, etc.)  Increase fiber in the form of non-starchy vegetables.  These help you feel full with very little carbohydrates and are good for gut health.  Nonstarchy vegetables include summer squash, onions, peppers, tomatoes, eggplant, broccoli, cauliflower, cabbage, lettuce, spinach.  Have small amounts of good fats such as avocado, nuts, olive  oil, nut butters, olives.  Add a little cheese to your meals to make them tasty.   Try to plan your meals for the week and do some meal preparation when able.  If possible, make lunches for the week ahead of time.  Plan a couple of dinners and make enough so you can have leftovers.  Build in a treat once a week.

## 2019-12-30 NOTE — Progress Notes (Signed)
Subjective:    Patient ID: Alisha Beltran, female    DOB: Oct 11, 1980, 39 y.o.   MRN: [id number]  HPI Chief Complaint  Patient presents with  . New Patient (Initial Visit)    referral for mammogram, discuss weight management. Referral obgyn  . Hypertension    discuss medication   This is a 39 yo female who presents today to establish care, for annual exam and above concerns. Accompanied by her husband. Will be starting a new job for [employer]. Enjoys reading. Was working as a Estate agent.   Last CPE- many years ago Mammo- never Pap- years ago, wishes to see gyn Tdap- years ago, declines today Flu- not usually Covid 19 vaccine- not vaccinated  Eye- last year Dental- not regular Exercise- no regular exercise  Sleep- sleeps well, feels rested Diet- eats one meal a day, has been working on weight loss. She and her husband are working on eating healthier. Eats one meal a day, starch, meat, vegetables. Drinking a red bull every day. No snacks.   HTN- for about 4 years. Medication use off and on. Feels dizzy with medication. Amlodipine 5 mg, has dizziness about 45 minutes after taking. Does not have dizziness when not taking. Tolerated HCTZ.   Vertigo, right ear pain- was seen last month at [facility]. Prescribed antibiotics, decongestants, antihistamines with improvement of symptoms.  Still has some fullness in her right ear.  Has a history of seasonal allergies.  Not currently taking cetirizine.     Review of Systems  Constitutional: Negative.   HENT: Positive for congestion, ear pain and sinus pain.   Eyes: Negative.   Respiratory: Positive for shortness of breath (with increased stress). Negative for cough.   Cardiovascular: Negative.   Gastrointestinal: Positive for blood in stool (rare, hard stools) and constipation (chronic).  Endocrine: Negative.   Genitourinary: Negative.   Musculoskeletal: Negative.   Skin: Negative.   Allergic/Immunologic: Positive for  environmental allergies.  Neurological: Positive for headaches.  Hematological: Negative.   Psychiatric/Behavioral: Negative for sleep disturbance.       Objective:   Physical Exam Physical Exam  Constitutional: She is oriented to person, place, and time. She appears well-developed and well-nourished. No distress. Obese.  HENT:  Head: Normocephalic and atraumatic.  Right Ear: External ear normal. TM dull.   Left Ear: External ear normal. TM normal.  Nose: Nose normal.  Mouth/Throat: Oropharynx is clear and moist. No oropharyngeal exudate.  Eyes: Conjunctivae are normal.   Neck: Normal range of motion. Neck supple. No JVD present. No thyromegaly present.  Cardiovascular: Normal rate, regular rhythm, normal heart sounds and intact distal pulses.  Pulmonary/Chest: Effort normal and breath sounds normal. Right breast exhibits no inverted nipple, no mass, no nipple discharge, no skin change and no tenderness. Left breast exhibits no inverted nipple, no mass, no nipple discharge, no skin change and no tenderness. Breasts are symmetrical.  Abdominal: Soft. Bowel sounds are normal. She exhibits no distension and no mass. There is no tenderness. There is no rebound and no guarding.  Musculoskeletal: Normal range of motion. She exhibits no edema or tenderness.  Lymphadenopathy:    She has no cervical adenopathy.  Neurological: She is alert and oriented to person, place, and time.   Skin: Skin is warm and dry. She is not diaphoretic.  Psychiatric: She has a normal mood and affect. Her behavior is normal. Judgment and thought content normal.  Vitals reviewed.     Pulse 97   Temp (!) 97  F (36.1 C) (Temporal)   Ht $R'5\' 4"'KT$  (1.626 m)   Wt (!) 306 lb 4 oz (138.9 kg)   LMP 12/17/2019 (Approximate)   SpO2 97%   BMI 52.57 kg/m  BP Readings from Last 3 Encounters:  11/21/19 (!) 153/117  06/10/17 (!) 178/115  01/27/17 (!) 181/120     BP: 132/88   Wt Readings from Last 3 Encounters:    12/30/19 (!) 306 lb 4 oz (138.9 kg)  11/21/19 293 lb (132.9 kg)  06/09/17 220 lb (99.8 kg)   Depression screen [facility] 2/9 12/30/2019 12/30/2019  Decreased Interest 1 1  Down, Depressed, Hopeless 1 1  PHQ - 2 Score 2 2  Altered sleeping 1 -  Tired, decreased energy 1 -  Change in appetite 0 -  Feeling bad or failure about yourself  1 -  Trouble concentrating 0 -  Moving slowly or fidgety/restless 0 -  Suicidal thoughts 0 -  PHQ-9 Score 5 -              Assessment & Plan:  1. Annual physical exam - Discussed and encouraged healthy lifestyle choices- adequate sleep, regular exercise, stress management and healthy food choices.    2. Morbid obesity with BMI of 50.0-59.9, adult ([place]) -Discussed importance of weight loss in relation to overall health, hypertension, increased risk factors for chronic disease -Provided written and verbal information regarding healthy food choices, lower carb diet, insulin resistance - TSH - Lipid Panel - Comprehensive metabolic panel - CBC with Differential - Vitamin D, 25-hydroxy - Amb Ref to Medical Weight Management  3. Essential hypertension -Initial blood pressure was very elevated in the office with large cuff, thigh cuff was used for recheck with reading within normal limits.  Suspect some degree of hypertension given prior readings, will restart her hydrochlorothiazide and follow-up in 4 weeks for be met in blood pressure recheck - TSH - Lipid Panel - Comprehensive metabolic panel - CBC with Differential - hydrochlorothiazide (HYDRODIURIL) 25 MG tablet; Take 1 tablet (25 mg total) by mouth daily.  Dispense: 90 tablet; Refill: 3  4. Screening for HIV without presence of risk factors - HIV Antibody (routine testing w rflx)  5. Encounter for hepatitis C screening test for low risk patient - Hepatitis C antibody  6. Screening for cervical cancer - Ambulatory referral to Gynecology  7. Chronic idiopathic constipation -Encouraged her  to use MiraLAX daily, increase fluids and fiber - TSH - Comprehensive metabolic panel - CBC with Differential  8. Seasonal allergic rhinitis, unspecified trigger - cetirizine (ZYRTEC ALLERGY) 10 MG tablet; Take 1 tablet (10 mg total) by mouth daily.  Dispense: 30 tablet; Refill: 2 - fluticasone (FLONASE) 50 MCG/ACT nasal spray; Place 2 sprays into both nostrils daily.  Dispense: 16 g; Refill: 6  This visit occurred during the SARS-CoV-2 public health emergency.  Safety protocols were in place, including screening questions prior to the visit, additional usage of staff PPE, and extensive cleaning of exam room while observing appropriate contact time as indicated for disinfecting solutions.    Clarene Reamer, FNP-BC  [facility]  12/30/2019 1:41 PM

## 2019-12-31 ENCOUNTER — Telehealth: Payer: Self-pay | Admitting: Obstetrics & Gynecology

## 2019-12-31 ENCOUNTER — Encounter: Payer: Self-pay | Admitting: Family Medicine

## 2019-12-31 ENCOUNTER — Other Ambulatory Visit (INDEPENDENT_AMBULATORY_CARE_PROVIDER_SITE_OTHER): Payer: 59

## 2019-12-31 DIAGNOSIS — D649 Anemia, unspecified: Secondary | ICD-10-CM | POA: Diagnosis not present

## 2019-12-31 LAB — HIV ANTIBODY (ROUTINE TESTING W REFLEX): HIV 1&2 Ab, 4th Generation: NONREACTIVE

## 2019-12-31 LAB — HEMOGLOBIN A1C: Hgb A1c MFr Bld: 6.2 % (ref 4.6–6.5)

## 2019-12-31 LAB — FERRITIN: Ferritin: 10 ng/mL (ref 10.0–291.0)

## 2019-12-31 LAB — HEPATITIS C ANTIBODY
Hepatitis C Ab: NONREACTIVE
SIGNAL TO CUT-OFF: 0.01 (ref <1.00)

## 2019-12-31 MED ORDER — VITAMIN D3 1.25 MG (50000 UT) PO TABS: 1.0000 | ORAL_TABLET | ORAL | 3 refills | Status: DC

## 2019-12-31 NOTE — Addendum Note (Signed)
Addended by: Olean Ree B on: 12/31/2019 02:13 PM   Modules accepted: Orders

## 2019-12-31 NOTE — Telephone Encounter (Signed)
[facility] referring for Screening for cervical cancer. Called and left voicemail for patient to call back to be scheduled.

## 2020-01-22 ENCOUNTER — Ambulatory Visit: Payer: BLUE CROSS/BLUE SHIELD | Admitting: Family Medicine

## 2020-01-25 ENCOUNTER — Ambulatory Visit: Payer: Self-pay | Admitting: Obstetrics and Gynecology

## 2020-04-09 ENCOUNTER — Encounter (INDEPENDENT_AMBULATORY_CARE_PROVIDER_SITE_OTHER): Payer: Self-pay

## 2020-08-02 ENCOUNTER — Ambulatory Visit
Admission: EM | Admit: 2020-08-02 | Discharge: 2020-08-02 | Disposition: A | Discharge status: Home / Self Care | Payer: 59 | Attending: Emergency Medicine | Admitting: Emergency Medicine

## 2020-08-02 ENCOUNTER — Other Ambulatory Visit: Payer: Self-pay

## 2020-08-02 ENCOUNTER — Encounter: Payer: Self-pay | Admitting: Emergency Medicine

## 2020-08-02 DIAGNOSIS — J069 Acute upper respiratory infection, unspecified: Secondary | ICD-10-CM

## 2020-08-02 MED ORDER — ONDANSETRON 4 MG PO TBDP: 4.0000 mg | ORAL_TABLET | Freq: Three times a day (TID) | ORAL | 0 refills | Status: DC | PRN

## 2020-08-02 MED ORDER — BENZONATATE 200 MG PO CAPS: 200.0000 mg | ORAL_CAPSULE | Freq: Three times a day (TID) | ORAL | 0 refills | Status: AC | PRN

## 2020-08-02 NOTE — Discharge Instructions (Signed)
COVID/flu test pending, monitor MyChart for results Tylenol and ibuprofen for headache, body aches  rest and fluids Zofran dissolved in mouth as needed for nausea/vomiting May continue over-the-counter Mucinex, add in Tessalon for cough Please follow-up if any symptoms not improving or worsening

## 2020-08-02 NOTE — ED Triage Notes (Signed)
Patient c/o productive cough, nausea, diarrhea, body aches, headache, vomiting x 2 days. Patient is not vaccinated.

## 2020-08-02 NOTE — ED Provider Notes (Signed)
[name]    CSN: [id number] Arrival date & time: 08/02/20  3149      History   Chief Complaint Chief Complaint  Patient presents with  . Cough    HPI Alisha Beltran is a 40 y.o. female history of hypertension presenting today for evaluation of cough.  Reports associated cough, body aches.  Reports nausea vomiting diarrhea x2 days.  Reports possible flu exposures at church.  Reports mild abdominal discomfort coming and going with diarrhea.  Reports associated chills and body aches  HPI  Past Medical History:  Diagnosis Date  . Allergy   . Hypertension     Patient Active Problem List   Diagnosis Date Noted  . Chronic idiopathic constipation 12/30/2019  . Seasonal allergic rhinitis 12/30/2019  . Morbid obesity with BMI of 50.0-59.9, adult (HCC) 08/14/2017  . Essential hypertension 08/14/2017    History reviewed. No pertinent surgical history.  OB History   No obstetric history on file.      Home Medications    Prior to Admission medications   Medication Sig Start Date End Date Taking? Authorizing Provider  acetaminophen (TYLENOL) 500 MG tablet Take 500-1,000 mg by mouth every 6 (six) hours as needed for moderate pain.    Yes [provider]  benzonatate (TESSALON) 200 MG capsule Take 1 capsule (200 mg total) by mouth 3 (three) times daily as needed for up to 7 days for cough. 08/02/20 08/09/20 Yes Damar Petit C, PA-C  cetirizine (ZYRTEC ALLERGY) 10 MG tablet Take 1 tablet (10 mg total) by mouth daily. 12/30/19  Yes Emi Belfast, FNP  Cholecalciferol (VITAMIN D3) 1.25 MG (50000 UT) TABS Take 1 tablet by mouth 2 (two) times a week. 12/31/19  Yes Emi Belfast, FNP  fluticasone (FLONASE) 50 MCG/ACT nasal spray Place 2 sprays into both nostrils daily. 12/30/19  Yes Emi Belfast, FNP  hydrochlorothiazide (HYDRODIURIL) 25 MG tablet Take 1 tablet (25 mg total) by mouth daily. 12/30/19  Yes Emi Belfast, FNP  ibuprofen  (ADVIL) 200 MG tablet Take 200 mg by mouth every 6 (six) hours as needed.   Yes [provider]  ondansetron (ZOFRAN ODT) 4 MG disintegrating tablet Take 1 tablet (4 mg total) by mouth every 8 (eight) hours as needed for nausea or vomiting. 08/02/20  Yes Darleene Cumpian C, PA-C  omeprazole (PRILOSEC) 20 MG capsule Take 1 capsule (20 mg total) by mouth daily. 06/10/17 11/21/19  Palumbo, April, MD    Family History Family History  Problem Relation Age of Onset  . Cancer Mother   . Early death Mother   . Heart attack Mother   . Hypertension Father     Social History Social History   Tobacco Use  . Smoking status: Never Smoker  . Smokeless tobacco: Never Used  Substance Use Topics  . Alcohol use: No  . Drug use: Never     Allergies   Other, Amlodipine, and Dust mite extract   Review of Systems Review of Systems  Constitutional: Negative for activity change, appetite change, chills, fatigue and fever.  HENT: Positive for congestion. Negative for ear pain, rhinorrhea, sinus pressure, sore throat and trouble swallowing.   Eyes: Negative for discharge and redness.  Respiratory: Positive for cough. Negative for chest tightness and shortness of breath.   Cardiovascular: Negative for chest pain.  Gastrointestinal: Positive for diarrhea and nausea. Negative for abdominal pain and vomiting.  Musculoskeletal: Negative for myalgias.  Skin: Negative for rash.  Neurological: Negative for  dizziness, light-headedness and headaches.     Physical Exam Triage Vital Signs ED Triage Vitals  Enc Vitals Group     BP 08/02/20 0958 (!) 158/88     Pulse Rate 08/02/20 0958 85     Resp --      Temp 08/02/20 0958 (!) 97.4 F (36.3 C)     Temp Source 08/02/20 0958 Oral     SpO2 08/02/20 0958 97 %     Weight --      Height --      Head Circumference --      Peak Flow --      Pain Score 08/02/20 1000 7     Pain Loc --      Pain Edu? --      Excl. in GC? --    No data  found.  Updated Vital Signs BP (!) 158/88 (BP Location: Left Arm)   Pulse 85   Temp (!) 97.4 F (36.3 C) (Oral)   LMP 07/19/2020   SpO2 97%   Visual Acuity Right Eye Distance:   Left Eye Distance:   Bilateral Distance:    Right Eye Near:   Left Eye Near:    Bilateral Near:     Physical Exam Vitals and nursing note reviewed.  Constitutional:      Appearance: She is well-developed.     Comments: No acute distress  HENT:     Head: Normocephalic and atraumatic.     Ears:     Comments: Bilateral ears without tenderness to palpation of external auricle, tragus and mastoid, EAC's without erythema or swelling, TM's with good bony landmarks and cone of light. Non erythematous.     Nose: Nose normal.     Mouth/Throat:     Comments: Oral mucosa pink and moist, no tonsillar enlargement or exudate. Posterior pharynx patent and nonerythematous, no uvula deviation or swelling. Normal phonation. Eyes:     Conjunctiva/sclera: Conjunctivae normal.  Cardiovascular:     Rate and Rhythm: Normal rate.  Pulmonary:     Effort: Pulmonary effort is normal. No respiratory distress.     Comments: Breathing comfortably at rest, CTABL, no wheezing, rales or other adventitious sounds auscultated Abdominal:     General: There is no distension.  Musculoskeletal:        General: Normal range of motion.     Cervical back: Neck supple.  Skin:    General: Skin is warm and dry.  Neurological:     Mental Status: She is alert and oriented to person, place, and time.      UC Treatments / Results  Labs (all labs ordered are listed, but only abnormal results are displayed) Labs Reviewed  COVID-19, FLU A+B NAA    EKG   Radiology No results found.  Procedures Procedures (including critical care time)  Medications Ordered in UC Medications - No data to display  Initial Impression / Assessment and Plan / UC Course  I have reviewed the triage vital signs and the nursing notes.  Pertinent  labs & imaging results that were available during my care of the patient were reviewed by me and considered in my medical decision making (see chart for details).     Viral URI with cough- COVID and flu test pending, suspect likely viral URI and recommending symptomatic and supportive care.   Discussed strict return precautions. Patient verbalized understanding and is agreeable with plan.  Final Clinical Impressions(s) / UC Diagnoses   Final diagnoses:  Viral URI with  cough     Discharge Instructions     COVID/flu test pending, monitor MyChart for results Tylenol and ibuprofen for headache, body aches  rest and fluids Zofran dissolved in mouth as needed for nausea/vomiting May continue over-the-counter Mucinex, add in Tessalon for cough Please follow-up if any symptoms not improving or worsening     ED Prescriptions    Medication Sig Dispense Auth. Provider   benzonatate (TESSALON) 200 MG capsule Take 1 capsule (200 mg total) by mouth 3 (three) times daily as needed for up to 7 days for cough. 28 capsule Suhaib Guzzo C, PA-C   ondansetron (ZOFRAN ODT) 4 MG disintegrating tablet Take 1 tablet (4 mg total) by mouth every 8 (eight) hours as needed for nausea or vomiting. 20 tablet Ytzel Gubler, [place] C, PA-C     PDMP not reviewed this encounter.   Lew Dawes, PA-C 08/02/20 1056

## 2020-08-03 LAB — COVID-19, FLU A+B NAA
Influenza A, NAA: NOT DETECTED
Influenza B, NAA: NOT DETECTED
SARS-CoV-2, NAA: NOT DETECTED

## 2021-04-28 ENCOUNTER — Emergency Department (HOSPITAL_BASED_OUTPATIENT_CLINIC_OR_DEPARTMENT_OTHER)
Admission: EM | Admit: 2021-04-28 | Discharge: 2021-04-28 | Disposition: A | Discharge status: Home / Self Care | Payer: Commercial Managed Care - HMO | Attending: Emergency Medicine | Admitting: Emergency Medicine

## 2021-04-28 ENCOUNTER — Encounter (HOSPITAL_BASED_OUTPATIENT_CLINIC_OR_DEPARTMENT_OTHER): Payer: Self-pay | Admitting: *Deleted

## 2021-04-28 ENCOUNTER — Other Ambulatory Visit: Payer: Self-pay

## 2021-04-28 DIAGNOSIS — K0889 Other specified disorders of teeth and supporting structures: Secondary | ICD-10-CM | POA: Insufficient documentation

## 2021-04-28 MED ORDER — AMOXICILLIN 500 MG PO CAPS: 500.0000 mg | ORAL_CAPSULE | Freq: Three times a day (TID) | ORAL | 0 refills | Status: DC

## 2021-04-28 MED ORDER — LIDOCAINE-EPINEPHRINE (PF) 2 %-1:200000 IJ SOLN
10.0000 mL | Freq: Once | INTRAMUSCULAR | Status: AC
  Administered 2021-04-28: 10 mL
  Filled 2021-04-28: qty 20

## 2021-04-28 MED ORDER — OXYCODONE-ACETAMINOPHEN 5-325 MG PO TABS: 1.0000 | ORAL_TABLET | Freq: Four times a day (QID) | ORAL | 0 refills | Status: DC | PRN

## 2021-04-28 NOTE — ED Provider Notes (Signed)
[facility] EMERGENCY DEPT Provider Note   CSN: [id number] Arrival date & time: 04/28/21  8768     History  Chief Complaint  Patient presents with   Dental Pain    Alisha Beltran is a 41 y.o. female.  41 year old female presents today for evaluation of right-sided dental pain onset last night.  States it kept her half of the night.  Has not taken anything prior to arrival.  Patient reports she presented to work and was allowed to come here for evaluation and has to return back to work.  Denies fever, chills, facial swelling.  States she chipped her tooth few months back.  Has not sought dental care since.  The history is provided by the patient. No language interpreter was used.      Home Medications Prior to Admission medications   Medication Sig Start Date End Date Taking? Authorizing Provider  acetaminophen (TYLENOL) 500 MG tablet Take 500-1,000 mg by mouth every 6 (six) hours as needed for moderate pain.     [provider]  cetirizine (ZYRTEC ALLERGY) 10 MG tablet Take 1 tablet (10 mg total) by mouth daily. 12/30/19   Emi Belfast, FNP  Cholecalciferol (VITAMIN D3) 1.25 MG (50000 UT) TABS Take 1 tablet by mouth 2 (two) times a week. 12/31/19   Emi Belfast, FNP  fluticasone (FLONASE) 50 MCG/ACT nasal spray Place 2 sprays into both nostrils daily. 12/30/19   Emi Belfast, FNP  hydrochlorothiazide (HYDRODIURIL) 25 MG tablet Take 1 tablet (25 mg total) by mouth daily. 12/30/19   Emi Belfast, FNP  ibuprofen (ADVIL) 200 MG tablet Take 200 mg by mouth every 6 (six) hours as needed.    [provider]  ondansetron (ZOFRAN ODT) 4 MG disintegrating tablet Take 1 tablet (4 mg total) by mouth every 8 (eight) hours as needed for nausea or vomiting. 08/02/20   Wieters, Hallie C, PA-C  omeprazole (PRILOSEC) 20 MG capsule Take 1 capsule (20 mg total) by mouth daily. 06/10/17 11/21/19  Palumbo, April, MD      Allergies    Other,  Amlodipine, and Dust mite extract    Review of Systems   Review of Systems  Constitutional:  Negative for chills and fever.  HENT:  Positive for dental problem. Negative for sore throat, trouble swallowing and voice change.   Neurological:  Negative for headaches.  All other systems reviewed and are negative.  Physical Exam Updated Vital Signs BP (!) 162/114 (BP Location: Right Arm)    Pulse 89    Temp 98.9 F (37.2 C)    Resp 16    Ht 5\' 4"  (1.626 m)    Wt (!) 139.3 kg    LMP 04/14/2021 (Approximate)    SpO2 97%    BMI 52.69 kg/m  Physical Exam Vitals and nursing note reviewed.  Constitutional:      General: She is not in acute distress.    Appearance: Normal appearance. She is not ill-appearing.  HENT:     Head: Normocephalic and atraumatic.     Nose: Nose normal.  Eyes:     Conjunctiva/sclera: Conjunctivae normal.  Cardiovascular:     Rate and Rhythm: Normal rate and regular rhythm.  Pulmonary:     Effort: Pulmonary effort is normal. No respiratory distress.  Musculoskeletal:        General: No deformity.  Skin:    Findings: No rash.  Neurological:     Mental Status: She is alert.    ED Results /  Procedures / Treatments   Labs (all labs ordered are listed, but only abnormal results are displayed) Labs Reviewed - No data to display  EKG None  Radiology No results found.  Procedures Dental Block  Date/Time: 04/28/2021 11:57 AM Performed by: Marita Kansas, PA-C Authorized by: Marita Kansas, PA-C   Consent:    Consent obtained:  Verbal   Consent given by:  Patient   Risks discussed:  Pain, unsuccessful block, nerve damage and intravascular injection   Alternatives discussed:  Alternative treatment Universal protocol:    Procedure explained and questions answered to patient or proxy's satisfaction: yes     Relevant documents present and verified: yes     Patient identity confirmed:  Verbally with patient and arm band Indications:    Indications: dental pain    Location:    Block type:  Inferior alveolar   Laterality:  Right Procedure details:    Needle gauge:  25 G   Anesthetic injected:  Lidocaine 1% WITH epi   Injection procedure:  Anatomic landmarks identified Post-procedure details:    Outcome:  Pain improved   Procedure completion:  Tolerated well, no immediate complications    Medications Ordered in ED Medications  lidocaine-EPINEPHrine (XYLOCAINE W/EPI) 2 %-1:200000 (PF) injection 10 mL (has no administration in time range)    ED Course/ Medical Decision Making/ A&P                           Medical Decision Making Risk Prescription drug management.   Medical Decision Making / ED Course   This patient presents to the ED for concern of dental pain, this involves an extensive number of treatment options, and is a complaint that carries with it a high risk of complications and morbidity.  The differential diagnosis includes dental caries, fractured tooth, dental abscess, peritonsillar abscess, retropharyngeal abscess, Ludwick's angina  MDM: 41 year old female presents today for evaluation of right-sided dental pain onset last night.  Without evidence of peritonsillar abscess, retropharyngeal abscess, Ludwick's angina, dental abscess.  Without associated swelling.  Multiple dental cavities present.  Poor dentition.  Inferior alveolar block provided with immediate symptomatic improvement.  Amoxicillin prescribed.  Few doses of Percocet prescribed.  On-call dental information given.  Return precautions discussed.  Patient voices understanding and is in agreement with plan.    Additional history obtained: -Additional history obtained from husband at bedside. -External records from outside source obtained and reviewed including: Chart review including previous notes, labs, imaging, consultation notes   Lab Tests: -I ordered, reviewed, and interpreted labs.   The pertinent results include:   Labs Reviewed - No data to display     EKG  EKG Interpretation  Date/Time:    Ventricular Rate:    PR Interval:    QRS Duration:   QT Interval:    QTC Calculation:   R Axis:     Text Interpretation:           Imaging Studies ordered: Not applicable   Medicines ordered and prescription drug management: Meds ordered this encounter  Medications   lidocaine-EPINEPHrine (XYLOCAINE W/EPI) 2 %-1:200000 (PF) injection 10 mL   amoxicillin (AMOXIL) 500 MG capsule    Sig: Take 1 capsule (500 mg total) by mouth 3 (three) times daily.    Dispense:  21 capsule    Refill:  0    Order Specific Question:   Supervising Provider    Answer:   Hyacinth Meeker, BRIAN [[id number]]   oxyCODONE-acetaminophen (PERCOCET/ROXICET)  5-325 MG tablet    Sig: Take 1 tablet by mouth every 6 (six) hours as needed for up to 5 doses for severe pain.    Dispense:  5 tablet    Refill:  0    Order Specific Question:   Supervising Provider    Answer:   MILLER, BRIAN [[id number]]    -I have reviewed the patients home medicines and have made adjustments as needed  Critical interventions Dental block for pain relief  Social Determinants of Health:  Factors impacting patients care include: She is unsure if she is able to get in with her primary dentist.  On-call dental referral given.   Reevaluation: After the interventions noted above, I reevaluated the patient and found that they have :improved  Co morbidities that complicate the patient evaluation  Past Medical History:  Diagnosis Date   Allergy    Hypertension       Dispostion: Patient is appropriate for discharge.  Discharged in stable condition.  Return precautions discussed.   Final Clinical Impression(s) / ED Diagnoses Final diagnoses:  Pain, dental    Rx / DC Orders ED Discharge Orders          Ordered    amoxicillin (AMOXIL) 500 MG capsule  3 times daily        04/28/21 1149    oxyCODONE-acetaminophen (PERCOCET/ROXICET) 5-325 MG tablet  Every 6 hours PRN        04/28/21 1149               Marita Kansas, PA-C 04/28/21 1159    Tanda Rockers A, DO 04/28/21 1551

## 2021-04-28 NOTE — ED Triage Notes (Signed)
Pt is here for right lower dental pain which began yesterday and kept her from sleeping during the night.  No facial swelling.

## 2021-04-28 NOTE — Discharge Instructions (Addendum)
Your evaluated in the emergency room for dental pain.  You received a dental block in the emergency room.  This may provide you up to 4 to 6 hours of relief.  I have also sent an antibiotic and a few doses of pain medication for you to keep on hand for severe breakthrough pain.  If you have worsening symptoms including swelling, drainage, fever, difficulty swallowing please return to the emergency room.  Otherwise follow-up with your dentist.  If you do not have a dentist I have attached on call dentist information. Your blood pressure is also elevated today.  This could be secondary to the significant pain you are having today.  I would like for you to keep a blood pressure diary and follow-up with your primary care provider to have this rechecked.  If it remains elevated your antihypertensive regimen may need to be addressed.

## 2021-07-10 ENCOUNTER — Ambulatory Visit (INDEPENDENT_AMBULATORY_CARE_PROVIDER_SITE_OTHER): Payer: Managed Care, Other (non HMO) | Admitting: Nurse Practitioner

## 2021-07-10 ENCOUNTER — Other Ambulatory Visit: Payer: Self-pay | Admitting: Nurse Practitioner

## 2021-07-10 ENCOUNTER — Encounter: Payer: Self-pay | Admitting: Nurse Practitioner

## 2021-07-10 ENCOUNTER — Ambulatory Visit (INDEPENDENT_AMBULATORY_CARE_PROVIDER_SITE_OTHER)
Admission: RE | Admit: 2021-07-10 | Discharge: 2021-07-10 | Disposition: A | Discharge status: Home / Self Care | Payer: Managed Care, Other (non HMO) | Source: Ambulatory Visit | Attending: Nurse Practitioner | Admitting: Nurse Practitioner

## 2021-07-10 VITALS — BP 154/110 | HR 78 | Temp 97.5°F | Resp 12 | Ht 64.0 in | Wt 303.4 lb

## 2021-07-10 DIAGNOSIS — M25572 Pain in left ankle and joints of left foot: Secondary | ICD-10-CM

## 2021-07-10 DIAGNOSIS — I10 Essential (primary) hypertension: Secondary | ICD-10-CM

## 2021-07-10 DIAGNOSIS — K921 Melena: Secondary | ICD-10-CM | POA: Insufficient documentation

## 2021-07-10 DIAGNOSIS — K5904 Chronic idiopathic constipation: Secondary | ICD-10-CM

## 2021-07-10 DIAGNOSIS — F32A Depression, unspecified: Secondary | ICD-10-CM

## 2021-07-10 DIAGNOSIS — E559 Vitamin D deficiency, unspecified: Secondary | ICD-10-CM

## 2021-07-10 DIAGNOSIS — R079 Chest pain, unspecified: Secondary | ICD-10-CM | POA: Diagnosis not present

## 2021-07-10 DIAGNOSIS — F419 Anxiety disorder, unspecified: Secondary | ICD-10-CM

## 2021-07-10 DIAGNOSIS — R7989 Other specified abnormal findings of blood chemistry: Secondary | ICD-10-CM

## 2021-07-10 DIAGNOSIS — R0789 Other chest pain: Secondary | ICD-10-CM | POA: Insufficient documentation

## 2021-07-10 LAB — COMPREHENSIVE METABOLIC PANEL
ALT: 7 U/L (ref 0–35)
AST: 14 U/L (ref 0–37)
Albumin: 3.8 g/dL (ref 3.5–5.2)
Alkaline Phosphatase: 82 U/L (ref 39–117)
BUN: 10 mg/dL (ref 6–23)
CO2: 25 mEq/L (ref 19–32)
Calcium: 9 mg/dL (ref 8.4–10.5)
Chloride: 106 mEq/L (ref 96–112)
Creatinine, Ser: 0.79 mg/dL (ref 0.40–1.20)
GFR: 92.72 mL/min (ref >60.00)
Glucose, Bld: 83 mg/dL (ref 70–99)
Potassium: 4.6 mEq/L (ref 3.5–5.1)
Sodium: 137 mEq/L (ref 135–145)
Total Bilirubin: 0.3 mg/dL (ref 0.2–1.2)
Total Protein: 6.9 g/dL (ref 6.0–8.3)

## 2021-07-10 LAB — IBC + FERRITIN
Ferritin: 6.8 ng/mL — ABNORMAL LOW (ref 10.0–291.0)
Iron: 29 ug/dL — ABNORMAL LOW (ref 42–145)
Saturation Ratios: 7.8 % — ABNORMAL LOW (ref 20.0–50.0)
TIBC: 372.4 ug/dL (ref 250.0–450.0)
Transferrin: 266 mg/dL (ref 212.0–360.0)

## 2021-07-10 LAB — CBC
HCT: 34.1 % — ABNORMAL LOW (ref 36.0–46.0)
Hemoglobin: 11.1 g/dL — ABNORMAL LOW (ref 12.0–15.0)
MCHC: 32.7 g/dL (ref 30.0–36.0)
MCV: 78 fl (ref 78.0–100.0)
Platelets: 497 10*3/uL — ABNORMAL HIGH (ref 150.0–400.0)
RBC: 4.37 Mil/uL (ref 3.87–5.11)
RDW: 16.7 % — ABNORMAL HIGH (ref 11.5–15.5)
WBC: 7.3 10*3/uL (ref 4.0–10.5)

## 2021-07-10 LAB — HEMOCCULT GUIAC POC 1CARD (OFFICE)
Card #1 Date: 5012023
Fecal Occult Blood, POC: NEGATIVE

## 2021-07-10 LAB — TSH: TSH: 2.52 u[IU]/mL (ref 0.35–5.50)

## 2021-07-10 LAB — FOLATE: Folate: 8.4 ng/mL (ref >5.9)

## 2021-07-10 LAB — VITAMIN D 25 HYDROXY (VIT D DEFICIENCY, FRACTURES): VITD: 17.34 ng/mL — ABNORMAL LOW (ref 30.00–100.00)

## 2021-07-10 LAB — VITAMIN B12: Vitamin B-12: 244 pg/mL (ref 211–911)

## 2021-07-10 MED ORDER — LABETALOL HCL 100 MG PO TABS: 100.0000 mg | ORAL_TABLET | Freq: Two times a day (BID) | ORAL | 1 refills | Status: DC

## 2021-07-10 MED ORDER — SERTRALINE HCL 25 MG PO TABS: 25.0000 mg | ORAL_TABLET | Freq: Every day | ORAL | 0 refills | Status: DC

## 2021-07-10 NOTE — Assessment & Plan Note (Signed)
Nonspecific chest pain that happened over a week ago.  Patient is hypertensive and uncontrolled.  Likely related to anxiety.  EKG within normal limits pending other lab results.  Follow-up if no improvement ?

## 2021-07-10 NOTE — Assessment & Plan Note (Signed)
Very nonspecific and intermittent.  Rectal exam normal in office today.  Hemoccult negative in office today.  Pending basic blood work.  If blood work comes back normal and patient would like to see GI we will place referral ?

## 2021-07-10 NOTE — Assessment & Plan Note (Signed)
Did administer PHQ-9 and GAD-7.  Patient denies HI/SI/AVH.  Did offer patient therapy, which she politely declined due to her schedule currently.  We will start patient on Zoloft 25 mg nightly.  We will have her follow-up in 6 weeks to see how she is doing.  Did go over common side effects including increased risk for SI/HI. ?

## 2021-07-10 NOTE — Assessment & Plan Note (Signed)
Patient was on HCTZ 25 mg daily without great control per patient report has tried amlodipine in the past with adverse drug event.  Patient states that she and her husband may try to get pregnant in the near future.  After joint discussion we did decide to place patient on labetalol 100 mg twice daily.  Patient is to get a blood pressure cuff and check her blood pressure every day over the next week.  We will have her follow-up in 6 weeks to see if any dose increase and follow-up on other health conditions ?

## 2021-07-10 NOTE — Assessment & Plan Note (Signed)
Previous CBC in office was abnormal.  Pending new CBC along with anemia panel studies. ?

## 2021-07-10 NOTE — Assessment & Plan Note (Signed)
No discrete injury.  He is tender to palpation and edematous on exam.  Will order ankle x-ray, pending result ?

## 2021-07-10 NOTE — Patient Instructions (Addendum)
Nice to see you today  ?I will be in touch with the lab results ?I want to see you back in 1 month, sooner if you need me to recheck the blood pressure ? ?Check Blood pressure at home daily for the next week while starting this medication  ? ?Follow up in 6 weeks, sooner if you need me ?

## 2021-07-10 NOTE — Assessment & Plan Note (Signed)
History of the same.  States that she has had blood in her stool but not where she is been constipated.  Hemoccult negative in office ?

## 2021-07-10 NOTE — Assessment & Plan Note (Signed)
Historical per patient's chart last vitamin D level less than 7.  Pending lab results today ?

## 2021-07-10 NOTE — Progress Notes (Signed)
? ?Established Patient Office Visit ? ?Subjective   ?Patient ID: Alisha Beltran, female    DOB: 23-Sep-1980  Age: 41 y.o. MRN: [id number] ? ?Chief Complaint  ?Patient presents with  ? Transfer of Care  ? Foot Swelling  ?  Left foot and ankle. She does recall falling about 4 to 5 months ago but not sure if this is related to the swelling. Before the fall she had intermittent swelling in that foot for a long time and now more steady. Trouble walking now. Pain radiated to her left knee yesterday.  ? Blood In Stools  ?  Off and on, has not noticed in a while. No hemorrhoids that she is aware of.   ? ? ?HPI ? ?Transfer of care ? ?HTN: States has not been on medication for approx 6 months. States that when she was on the medicatoin her BP was elevated. BP at [facility] and will check every 2-3 weeks.  Patient does not currently have blood pressure cuff at home ? ?Blood in stool: states that it is intermittent in nature. States she has issues where she will not be constipated. Will have mucous and blood in the mucous and tissue paper. No belly pain. No history of seeing A GI provider. ? ?Left ankle: interminttently swelling for some time. She did fall in jan ish. Since then she has noticed the left ankle has been swollen since. Dull achy pain. States that tylenol and ibuprofen. Pain worse with movement  ? ? ? ?Review of Systems  ?Constitutional:  Positive for malaise/fatigue. Negative for chills and fever.  ?Respiratory:  Negative for cough and shortness of breath.   ?Cardiovascular:  Positive for leg swelling. Negative for chest pain (last week or so.).  ?     Described as a tightness ?  ?Gastrointestinal:  Positive for constipation. Negative for abdominal pain, diarrhea, nausea and vomiting.  ?     BM once a week   ?Genitourinary:  Negative for dysuria and hematuria.  ?Neurological:  Negative for dizziness and headaches.  ?Psychiatric/Behavioral:  Negative for hallucinations and suicidal ideas.   ? ?  ?Objective:  ?  ? ?BP (!)  154/110   Pulse 78   Temp (!) 97.5 ?F (36.4 ?C)   Resp 12   Ht 5\' 4"  (1.626 m)   Wt (!) 303 lb 7 oz (137.6 kg)   LMP 07/03/2021   SpO2 99%   BMI 52.08 kg/m?  ? ? ?Physical Exam ?Vitals and nursing note reviewed.  ?Constitutional:   ?   Appearance: Normal appearance. She is obese.  ?HENT:  ?   Right Ear: Tympanic membrane, ear canal and external ear normal.  ?   Left Ear: Tympanic membrane, ear canal and external ear normal.  ?   Mouth/Throat:  ?   Mouth: Mucous membranes are moist.  ?   Pharynx: Oropharynx is clear.  ?Eyes:  ?   Extraocular Movements: Extraocular movements intact.  ?   Pupils: Pupils are equal, round, and reactive to light.  ?Cardiovascular:  ?   Rate and Rhythm: Normal rate and regular rhythm.  ?   Pulses: Normal pulses.  ?   Heart sounds: Normal heart sounds.  ?Pulmonary:  ?   Effort: Pulmonary effort is normal.  ?   Breath sounds: Normal breath sounds.  ?Abdominal:  ?   General: Bowel sounds are normal. There is no distension.  ?   Palpations: There is no mass.  ?   Tenderness: There is abdominal tenderness.  ?  Hernia: No hernia is present.  ?Musculoskeletal:     ?   General: Tenderness present.  ?   Right lower leg: No edema.  ?   Left lower leg: Edema present.  ?     Legs: ? ?Skin: ?   General: Skin is warm.  ?Neurological:  ?   General: No focal deficit present.  ?   Mental Status: She is alert.  ? ? ? ?Results for orders placed or performed in visit on 07/10/21  ?Hemoccult - 1 Card (office)  ?Result Value Ref Range  ? Fecal Occult Blood, POC Negative Negative  ? Card #1 Date [id number]   ? Card #2 Fecal Occult Blod, POC    ? Card #2 Date    ? Card #3 Fecal Occult Blood, POC    ? Card #3 Date    ? ? ? ? ?The 10-year ASCVD risk score (Arnett DK, et al., 2019) is: 6% ? ?  ?Assessment & Plan:  ? ?Problem List Items Addressed This Visit   ? ?  ? Cardiovascular and Mediastinum  ? Essential hypertension  ?  Patient was on HCTZ 25 mg daily without great control per patient report has tried  amlodipine in the past with adverse drug event.  Patient states that she and her husband may try to get pregnant in the near future.  After joint discussion we did decide to place patient on labetalol 100 mg twice daily.  Patient is to get a blood pressure cuff and check her blood pressure every day over the next week.  We will have her follow-up in 6 weeks to see if any dose increase and follow-up on other health conditions ? ?  ?  ? Relevant Medications  ? labetalol (NORMODYNE) 100 MG tablet  ? Other Relevant Orders  ? EKG 12-Lead (Completed)  ? CBC  ? Comprehensive metabolic panel  ? TSH  ?  ? Digestive  ? Chronic idiopathic constipation  ?  History of the same.  States that she has had blood in her stool but not where she is been constipated.  Hemoccult negative in office ? ?  ?  ?  ? Other  ? Acute left ankle pain - Primary  ?  No discrete injury.  He is tender to palpation and edematous on exam.  Will order ankle x-ray, pending result ? ?  ?  ? Relevant Orders  ? DG Ankle Complete Left  ? Blood in stool  ?  Very nonspecific and intermittent.  Rectal exam normal in office today.  Hemoccult negative in office today.  Pending basic blood work.  If blood work comes back normal and patient would like to see GI we will place referral ? ?  ?  ? Relevant Orders  ? Hemoccult - 1 Card (office) (Completed)  ? Chest pain  ?  Nonspecific chest pain that happened over a week ago.  Patient is hypertensive and uncontrolled.  Likely related to anxiety.  EKG within normal limits pending other lab results.  Follow-up if no improvement ? ?  ?  ? Relevant Orders  ? EKG 12-Lead (Completed)  ? Abnormal CBC  ?  Previous CBC in office was abnormal.  Pending new CBC along with anemia panel studies. ? ?  ?  ? Relevant Orders  ? IBC + Ferritin  ? Vitamin B12  ? Folate  ? Anxiety and depression  ?  Did administer PHQ-9 and GAD-7.  Patient denies HI/SI/AVH.  Did offer patient  therapy, which she politely declined due to her schedule  currently.  We will start patient on Zoloft 25 mg nightly.  We will have her follow-up in 6 weeks to see how she is doing.  Did go over common side effects including increased risk for SI/HI. ? ?  ?  ? Relevant Medications  ? sertraline (ZOLOFT) 25 MG tablet  ? Vitamin D deficiency  ?  Historical per patient's chart last vitamin D level less than 7.  Pending lab results today ? ?  ?  ? Relevant Orders  ? VITAMIN D 25 Hydroxy (Vit-D Deficiency, Fractures)  ? ? ?Return in about 6 weeks (around 08/21/2021) for BP and depression recheck.  ? ? ?Audria NineMatt Knox Holdman, NP ? ?

## 2021-07-11 ENCOUNTER — Encounter: Payer: Self-pay | Admitting: Nurse Practitioner

## 2021-07-11 DIAGNOSIS — K921 Melena: Secondary | ICD-10-CM

## 2021-07-11 DIAGNOSIS — E559 Vitamin D deficiency, unspecified: Secondary | ICD-10-CM

## 2021-07-11 DIAGNOSIS — D509 Iron deficiency anemia, unspecified: Secondary | ICD-10-CM

## 2021-07-12 MED ORDER — VITAMIN D (ERGOCALCIFEROL) 1.25 MG (50000 UNIT) PO CAPS: 50000.0000 [IU] | ORAL_CAPSULE | ORAL | 0 refills | Status: DC

## 2021-07-12 MED ORDER — IRON (FERROUS SULFATE) 325 (65 FE) MG PO TABS: 325.0000 mg | ORAL_TABLET | Freq: Every day | ORAL | 2 refills | Status: DC

## 2021-08-10 ENCOUNTER — Ambulatory Visit
Admission: EM | Admit: 2021-08-10 | Discharge: 2021-08-10 | Disposition: A | Discharge status: Home / Self Care | Payer: Commercial Managed Care - HMO | Attending: Family Medicine | Admitting: Family Medicine

## 2021-08-10 ENCOUNTER — Encounter: Payer: Self-pay | Admitting: Emergency Medicine

## 2021-08-10 DIAGNOSIS — I10 Essential (primary) hypertension: Secondary | ICD-10-CM

## 2021-08-10 DIAGNOSIS — J069 Acute upper respiratory infection, unspecified: Secondary | ICD-10-CM

## 2021-08-10 DIAGNOSIS — J00 Acute nasopharyngitis [common cold]: Secondary | ICD-10-CM | POA: Diagnosis not present

## 2021-08-10 LAB — POCT RAPID STREP A (OFFICE): Rapid Strep A Screen: NEGATIVE

## 2021-08-10 MED ORDER — IPRATROPIUM BROMIDE 0.03 % NA SOLN: 2.0000 | Freq: Three times a day (TID) | NASAL | 0 refills | Status: DC | PRN

## 2021-08-10 MED ORDER — PROMETHAZINE-DM 6.25-15 MG/5ML PO SYRP: 5.0000 mL | ORAL_SOLUTION | Freq: Two times a day (BID) | ORAL | 0 refills | Status: DC | PRN

## 2021-08-10 MED ORDER — BENZONATATE 200 MG PO CAPS: 200.0000 mg | ORAL_CAPSULE | Freq: Three times a day (TID) | ORAL | 0 refills | Status: DC | PRN

## 2021-08-10 NOTE — Discharge Instructions (Addendum)
Take you second dose of labetalol when you return home as your blood pressure is elevated. Your symptoms today are a combination of viral and allergy symptoms. Take your allergy medication.  I have prescribed you benzonatate Perles to take 200 mg 3 times daily as needed for cough. I also prescribed you Promethazine DM which is a cough syrup for you to take up to twice daily.  This is one of the cough syrup that can long-term increase your blood pressure therefore take on if  the benzonatate Perles are ineffective.  Continue your allergy medication.  Atrovent nasal spray you may use up to 3 times per day as needed to help with your nasal symptoms.  Hydrate well with fluids.

## 2021-08-10 NOTE — ED Provider Notes (Signed)
Roderic Palau    CSN: [id number] Arrival date & time: 08/10/21  1619      History   Chief Complaint Chief Complaint  Patient presents with   Nasal Congestion   Sinus pressure    Sore Throat   Cough    HPI Alisha Beltran is a 41 y.o. female.   HPI Patient presents today with a 3-day history of sore throat, sinus pressure, cough, postnasal drainage.  Patient suffers from chronic allergies and is currently taking Xyzal previously taking cetirizine recently change medications.  She denies any fever.  On arrival today patient's blood pressure is 180/110.  She recently was prescribed labetalol and has not taken medication today.  She also has been taking multiple over-the-counter medications which may have also attributed to elevation of blood pressure here today.  She is not having any shortness of breath or chest pain all symptoms are upper respiratory related.   Past Medical History:  Diagnosis Date   Allergy    Hypertension     Patient Active Problem List   Diagnosis Date Noted   Acute left ankle pain 07/10/2021   Blood in stool 07/10/2021   Chest pain 07/10/2021   Abnormal CBC 07/10/2021   Anxiety and depression 07/10/2021   Vitamin D deficiency 07/10/2021   Chronic idiopathic constipation 12/30/2019   Seasonal allergic rhinitis 12/30/2019   Morbid obesity with BMI of 50.0-59.9, adult ([place]) 08/14/2017   Essential hypertension 08/14/2017    Past Surgical History:  Procedure Laterality Date   NO PAST SURGERIES      OB History   No obstetric history on file.      Home Medications    Prior to Admission medications   Medication Sig Start Date End Date Taking? Authorizing Provider  benzonatate (TESSALON) 200 MG capsule Take 1 capsule (200 mg total) by mouth 3 (three) times daily as needed for cough. 08/10/21  Yes Scot Jun, FNP  ipratropium (ATROVENT) 0.03 % nasal spray Place 2 sprays into both nostrils 3 (three) times daily as needed for rhinitis.  08/10/21  Yes Scot Jun, FNP  promethazine-dextromethorphan (PROMETHAZINE-DM) 6.25-15 MG/5ML syrup Take 5 mLs by mouth 2 (two) times daily as needed for cough. 08/10/21  Yes Scot Jun, FNP  acetaminophen (TYLENOL) 500 MG tablet Take 500-1,000 mg by mouth every 6 (six) hours as needed for moderate pain.     [provider]  cetirizine (ZYRTEC ALLERGY) 10 MG tablet Take 1 tablet (10 mg total) by mouth daily. 12/30/19   Elby Beck, FNP  hydrochlorothiazide (HYDRODIURIL) 25 MG tablet Take 1 tablet (25 mg total) by mouth daily. Patient not taking: Reported on 07/10/2021 12/30/19   Elby Beck, FNP  ibuprofen (ADVIL) 200 MG tablet Take 200 mg by mouth every 6 (six) hours as needed.    [provider]  Iron, Ferrous Sulfate, 325 (65 Fe) MG TABS Take 325 mg by mouth daily. 07/12/21   Michela Pitcher, NP  labetalol (NORMODYNE) 100 MG tablet Take 1 tablet (100 mg total) by mouth 2 (two) times daily. 07/10/21   Michela Pitcher, NP  sertraline (ZOLOFT) 25 MG tablet Take 1 tablet (25 mg total) by mouth daily. 07/10/21   Michela Pitcher, NP  Vitamin D, Ergocalciferol, (DRISDOL) 1.25 MG (50000 UNIT) CAPS capsule Take 1 capsule (50,000 Units total) by mouth every 7 (seven) days. 07/12/21   Michela Pitcher, NP    Family History Family History  Problem Relation Age of Onset  Cancer Mother    Early death Mother 38   Heart attack Mother    Hypertension Father     Social History Social History   Tobacco Use   Smoking status: Never   Smokeless tobacco: Never  Vaping Use   Vaping Use: Never used  Substance Use Topics   Alcohol use: No   Drug use: Never     Allergies   Other, Amlodipine, and Dust mite extract   Review of Systems Review of Systems Pertinent negatives listed in HPI   Physical Exam Triage Vital Signs ED Triage Vitals  Enc Vitals Group     BP 08/10/21 1633 (!) 180/110     Pulse Rate 08/10/21 1633 73     Resp 08/10/21 1633 18     Temp 08/10/21  1633 98.5 F (36.9 C)     Temp Source 08/10/21 1633 Oral     SpO2 08/10/21 1633 98 %     Weight --      Height --      Head Circumference --      Peak Flow --      Pain Score 08/10/21 1629 7     Pain Loc --      Pain Edu? --      Excl. in [place]? --    No data found.  Updated Vital Signs BP (!) 180/110 (BP Location: Left Leg)   Pulse 73   Temp 98.5 F (36.9 C) (Oral)   Resp 18   LMP 07/31/2021   SpO2 98%   Visual Acuity Right Eye Distance:   Left Eye Distance:   Bilateral Distance:    Right Eye Near:   Left Eye Near:    Bilateral Near:     Physical Exam Vitals reviewed.  Constitutional:      Appearance: She is well-developed.  HENT:     Head: Normocephalic and atraumatic.     Right Ear: Tympanic membrane and ear canal normal.     Left Ear: Ear canal normal.     Mouth/Throat:     Pharynx: Posterior oropharyngeal erythema present.     Tonsils: 0 on the right. 0 on the left.  Eyes:     Conjunctiva/sclera: Conjunctivae normal.     Pupils: Pupils are equal, round, and reactive to light.  Cardiovascular:     Rate and Rhythm: Normal rate and regular rhythm.  Pulmonary:     Effort: Pulmonary effort is normal.     Breath sounds: Normal breath sounds.  Musculoskeletal:     Cervical back: Normal range of motion.  Lymphadenopathy:     Cervical: No cervical adenopathy.  Skin:    General: Skin is warm and dry.     Capillary Refill: Capillary refill takes less than 2 seconds.  Neurological:     General: No focal deficit present.     Mental Status: She is alert and oriented to person, place, and time.     UC Treatments / Results  Labs (all labs ordered are listed, but only abnormal results are displayed) Labs Reviewed  POCT RAPID STREP A (OFFICE)    EKG   Radiology No results found.  Procedures Procedures (including critical care time)  Medications Ordered in UC Medications - No data to display  Initial Impression / Assessment and Plan / UC Course  I  have reviewed the triage vital signs and the nursing notes.  Pertinent labs & imaging results that were available during my care of the patient were reviewed by  me and considered in my medical decision making (see chart for details).    Accelerated hypertension , take Labetalol today. Check you BP at home. Follow-up with PCP if BP remains elevated. Avoid OTC medication.  Viral URI with cough  Acute Rhinitis   Symptom treatment warranted only with benzonatate Perles, Promethazine DM limited to twice per day as this can temporarily increase her blood pressure, Atrovent nose spray 3 times daily as needed.  Hydrate well with fluids.  Continue daily allergy medications. Final Clinical Impressions(s) / UC Diagnoses   Final diagnoses:  Accelerated hypertension  Viral URI with cough  Acute rhinitis     Discharge Instructions      Take you second dose of labetalol when you return home as your blood pressure is elevated. Your symptoms today are a combination of viral and allergy symptoms. Take your allergy medication.  I have prescribed you benzonatate Perles to take 200 mg 3 times daily as needed for cough. I also prescribed you Promethazine DM which is a cough syrup for you to take up to twice daily.  This is one of the cough syrup that can long-term increase your blood pressure therefore take on if  the benzonatate Perles are ineffective.  Continue your allergy medication.  Atrovent nasal spray you may use up to 3 times per day as needed to help with your nasal symptoms.  Hydrate well with fluids.     ED Prescriptions     Medication Sig Dispense Auth. Provider   benzonatate (TESSALON) 200 MG capsule Take 1 capsule (200 mg total) by mouth 3 (three) times daily as needed for cough. 30 capsule Scot Jun, FNP   promethazine-dextromethorphan (PROMETHAZINE-DM) 6.25-15 MG/5ML syrup Take 5 mLs by mouth 2 (two) times daily as needed for cough. 118 mL Scot Jun, FNP   ipratropium  (ATROVENT) 0.03 % nasal spray Place 2 sprays into both nostrils 3 (three) times daily as needed for rhinitis. 30 mL Scot Jun, FNP      PDMP not reviewed this encounter.   Scot Jun, FNP 08/10/21 1740

## 2021-08-10 NOTE — ED Triage Notes (Signed)
Pt c/o sinus pressure, ST, cough and runny nose x 3 days.

## 2021-08-23 ENCOUNTER — Ambulatory Visit: Payer: Managed Care, Other (non HMO) | Admitting: Nurse Practitioner

## 2021-09-05 ENCOUNTER — Other Ambulatory Visit: Payer: Self-pay

## 2021-09-05 DIAGNOSIS — I10 Essential (primary) hypertension: Secondary | ICD-10-CM

## 2021-09-06 MED ORDER — LABETALOL HCL 100 MG PO TABS: 100.0000 mg | ORAL_TABLET | Freq: Two times a day (BID) | ORAL | 0 refills | Status: DC

## 2021-09-11 ENCOUNTER — Ambulatory Visit: Payer: Managed Care, Other (non HMO) | Admitting: Nurse Practitioner

## 2021-09-13 ENCOUNTER — Ambulatory Visit: Payer: Managed Care, Other (non HMO) | Admitting: Nurse Practitioner

## 2021-09-29 ENCOUNTER — Encounter: Payer: Self-pay | Admitting: Nurse Practitioner

## 2021-10-09 ENCOUNTER — Ambulatory Visit (INDEPENDENT_AMBULATORY_CARE_PROVIDER_SITE_OTHER): Payer: Commercial Managed Care - HMO | Admitting: Nurse Practitioner

## 2021-10-09 VITALS — BP 130/92 | HR 88 | Temp 96.8°F | Resp 12 | Ht 64.0 in | Wt 300.4 lb

## 2021-10-09 DIAGNOSIS — F32A Depression, unspecified: Secondary | ICD-10-CM

## 2021-10-09 DIAGNOSIS — F419 Anxiety disorder, unspecified: Secondary | ICD-10-CM

## 2021-10-09 DIAGNOSIS — I10 Essential (primary) hypertension: Secondary | ICD-10-CM | POA: Diagnosis not present

## 2021-10-09 DIAGNOSIS — M25572 Pain in left ankle and joints of left foot: Secondary | ICD-10-CM

## 2021-10-09 LAB — HM PAP SMEAR: HM Pap smear: NEGATIVE

## 2021-10-09 LAB — RESULTS CONSOLE HPV: CHL HPV: NEGATIVE

## 2021-10-09 LAB — CBC AND DIFFERENTIAL: Hemoglobin: 10.6 — AB (ref 12.0–16.0)

## 2021-10-09 MED ORDER — LABETALOL HCL 100 MG PO TABS: 100.0000 mg | ORAL_TABLET | Freq: Two times a day (BID) | ORAL | 0 refills | Status: DC

## 2021-10-09 MED ORDER — BUPROPION HCL ER (XL) 150 MG PO TB24: 150.0000 mg | ORAL_TABLET | Freq: Every day | ORAL | 0 refills | Status: DC

## 2021-10-09 NOTE — Assessment & Plan Note (Signed)
Patient did not tolerate sertraline 25 mg.  Stop medication.  We will start her on Wellbutrin 150 mg daily.  Patient to follow-up in 1 month virtually for recheck

## 2021-10-09 NOTE — Patient Instructions (Signed)
Nice to see you today Schedule a 1 month virtual visit for a recheck and see how the new medication is doing Take vitamin D 2,000IU over the counter Dr Karleen Hampshire Copland is the sports medicine physician in office. You can make an appointment with him anytime. The office number is [phone]

## 2021-10-09 NOTE — Progress Notes (Signed)
Established Patient Office Visit  Subjective   Patient ID: Alisha Beltran, female    DOB: [birth year]  Age: 41 y.o. MRN: [id number]  Chief Complaint  Patient presents with   Hypertension    Follow up-readings have been around 140/80- at times a point or 2 higher than that.   Depression    Follow up-stopped medication after taking it for 2 weeks-she felt lathargic, "out of it," and felt more emotional.      Depression: States that she took the medication for approx 2 weeks and states it made her feel more emotional. States her spouse noticed that she was not doing well.  Patient discontinued the medication  States that she is still dealing with anger. States that she feels that overwhelmed.  States she is dealing with lack of motivation  HTN: 140/80 at home. States twice a week.  Blood pressure fairly decent today.  Patient states she has been taking medication as prescribed now.      07/10/2021   12:48 PM 12/30/2019   12:53 PM 12/30/2019   12:28 PM  PHQ9 SCORE ONLY  PHQ-9 Total Score 15 5 2        07/10/2021   12:49 PM  GAD 7 : Generalized Anxiety Score  Nervous, Anxious, on Edge 3  Control/stop worrying 1  Worry too much - different things 2  Trouble relaxing 1  Restless 0  Easily annoyed or irritable 3  Afraid - awful might happen 0  Total GAD 7 Score 10        Review of Systems  Constitutional:  Positive for malaise/fatigue. Negative for chills and fever.  Respiratory:  Negative for shortness of breath.   Cardiovascular:  Negative for chest pain.  Psychiatric/Behavioral:  Positive for depression. Negative for hallucinations and suicidal ideas.       Objective:     BP (!) 130/92   Pulse 88   Temp (!) 96.8 F (36 C) (Temporal)   Resp 12   Ht 5\' 4"  (1.626 m)   Wt (!) 300 lb 6 oz (136.2 kg)   SpO2 98%   BMI 51.56 kg/m  BP Readings from Last 3 Encounters:  10/09/21 (!) 130/92  08/10/21 (!) 180/110  07/10/21 (!) 154/110   Wt Readings from Last 3  Encounters:  10/09/21 (!) 300 lb 6 oz (136.2 kg)  07/10/21 (!) 303 lb 7 oz (137.6 kg)  04/28/21 (!) 306 lb 15.9 oz (139.3 kg)      Physical Exam Vitals and nursing note reviewed.  Constitutional:      Appearance: Normal appearance. She is obese.  Cardiovascular:     Rate and Rhythm: Normal rate and regular rhythm.     Heart sounds: Normal heart sounds.  Pulmonary:     Effort: Pulmonary effort is normal.     Breath sounds: Normal breath sounds.  Musculoskeletal:     Left lower leg: Edema present.  Neurological:     Mental Status: She is alert.      No results found for any visits on 10/09/21.    The 10-year ASCVD risk score (Arnett DK, et al., 2019) is: 2.5%    Assessment & Plan:   Problem List Items Addressed This Visit       Cardiovascular and Mediastinum   Essential hypertension    Patient currently on labetalol 100 mg twice daily.  Continue taking medication as prescribed continue taking blood pressure at home.  Patient is working on lifestyle modifications she is lost a few  pounds thus far no changes in medication today      Relevant Medications   labetalol (NORMODYNE) 100 MG tablet     Other   Acute left ankle pain   Anxiety and depression - Primary    Patient did not tolerate sertraline 25 mg.  Stop medication.  We will start her on Wellbutrin 150 mg daily.  Patient to follow-up in 1 month virtually for recheck      Relevant Medications   buPROPion (WELLBUTRIN XL) 150 MG 24 hr tablet   Other Relevant Orders   Ambulatory referral to Psychology    Return in about 4 weeks (around 11/06/2021) for [place] visit for recheck of depression and medicatoin .    Audria Nine, NP

## 2021-10-09 NOTE — Assessment & Plan Note (Signed)
Patient currently on labetalol 100 mg twice daily.  Continue taking medication as prescribed continue taking blood pressure at home.  Patient is working on lifestyle modifications she is lost a few pounds thus far no changes in medication today

## 2021-10-11 ENCOUNTER — Other Ambulatory Visit: Payer: Self-pay | Admitting: Nurse Practitioner

## 2021-10-11 DIAGNOSIS — E559 Vitamin D deficiency, unspecified: Secondary | ICD-10-CM

## 2021-10-18 ENCOUNTER — Encounter (INDEPENDENT_AMBULATORY_CARE_PROVIDER_SITE_OTHER): Payer: Self-pay

## 2021-10-22 ENCOUNTER — Encounter: Payer: Self-pay | Admitting: Nurse Practitioner

## 2021-10-24 NOTE — Telephone Encounter (Signed)
Can we reach out to the patient and get more information please. I know I am treating her for HTN.

## 2021-11-02 NOTE — Telephone Encounter (Signed)
Left message for patient to call back and called Alisha Beltran and asked to have patient call us back to be triaged. Mychart message was not viewed and did not get a call back since last week.

## 2021-11-03 NOTE — Telephone Encounter (Signed)
Noted  

## 2021-11-03 NOTE — Telephone Encounter (Signed)
Unable to reach pt by phone; I spoke with Daaron (DPR signed) and he said he forgot to give pt message from yesterday. Pt already has VV scheduled for 11/06/21 and want to see if can change to in person visit. 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 UC & ED precautions given to pts husband and he voiced understanding. Sending note to Audria Nine NP and Anastasiya CMA.

## 2021-11-06 ENCOUNTER — Telehealth: Payer: Commercial Managed Care - HMO | Admitting: Nurse Practitioner

## 2021-11-06 ENCOUNTER — Ambulatory Visit: Payer: Commercial Managed Care - HMO | Admitting: Family Medicine

## 2021-11-07 NOTE — Progress Notes (Deleted)
    Michaila Kenney T. Ashok Sawaya, MD, CAQ Sports Medicine [facility] at [facility] [address] [place], [address]  Phone: [phone]  FAX: [phone]  Alisha Beltran - 41 y.o. female  MRN [id number]  Date of Birth: 1980-12-21  Date: 11/08/2021  PCP: Eden Emms, NP  Referral: Eden Emms, NP  No chief complaint on file.  Subjective:   Alisha Beltran is a 41 y.o. very pleasant female patient with There is no height or weight on file to calculate BMI. who presents with the following:  The patient presents for a consultation regarding ongoing ankle pain.    Review of Systems is noted in the HPI, as appropriate  Objective:   There were no vitals taken for this visit.  GEN: No acute distress; alert,appropriate. PULM: Breathing comfortably in no respiratory distress PSYCH: Normally interactive.   Laboratory and Imaging Data:  Assessment and Plan:   ***

## 2021-11-07 NOTE — Telephone Encounter (Signed)
See previous notes. Patient has an appointment 11/08/21 scheduled with Dr. Patsy Lager for ankle pain.

## 2021-11-08 ENCOUNTER — Ambulatory Visit: Payer: Commercial Managed Care - HMO | Admitting: Family Medicine

## 2021-11-29 ENCOUNTER — Other Ambulatory Visit: Payer: Self-pay | Admitting: Nurse Practitioner

## 2021-11-29 DIAGNOSIS — I10 Essential (primary) hypertension: Secondary | ICD-10-CM

## 2021-12-05 ENCOUNTER — Telehealth: Payer: Self-pay | Admitting: Nurse Practitioner

## 2021-12-05 DIAGNOSIS — I10 Essential (primary) hypertension: Secondary | ICD-10-CM

## 2021-12-05 MED ORDER — LABETALOL HCL 100 MG PO TABS: 100.0000 mg | ORAL_TABLET | Freq: Two times a day (BID) | ORAL | 0 refills | Status: DC

## 2021-12-05 NOTE — Telephone Encounter (Signed)
Patient states she called [facility] earlier this month and they did not have RX on file for her from 10/09/21 for 90 day supply so she kept taking what she had left over at home. She is out of medication now. [facility] and they did not have patient in their system or received any medications on her behalf at all. I called patient and I have sent RX for 30 day supply to [place] [facility]. Patient was able to be off on 12/18/21 and will come in at that time for follow up. She missed her appointment in the past due to not been able to leave work. Patient is aware to keep her follow up appointment. She will keep a check on her b/p, she started having headaches with not having her medication. She will update [place] if h/a does not resolve or if b/p  when she checks is elevated.

## 2021-12-05 NOTE — Telephone Encounter (Signed)
Left message to call back Labetalol was refilled on 10/09/21 to [facility] day supply, need to verify if patient picked this up? This is to last until 01/09/22

## 2021-12-05 NOTE — Telephone Encounter (Signed)
Patient called in and stated that she wouldn't be able to get a medication refill until she gets an appointment with [facility]. She stated she can't go without her medication and she is worried about that. The medication is labetalol (NORMODYNE) 100 MG tablet. Please advise. Thank you!

## 2021-12-05 NOTE — Addendum Note (Signed)
Addended by: Kris Mouton on: 12/05/2021 04:03 PM   Modules accepted: Orders

## 2021-12-18 ENCOUNTER — Encounter: Payer: Self-pay | Admitting: Nurse Practitioner

## 2021-12-18 ENCOUNTER — Ambulatory Visit: Payer: Commercial Managed Care - HMO | Admitting: Family Medicine

## 2021-12-18 ENCOUNTER — Ambulatory Visit (INDEPENDENT_AMBULATORY_CARE_PROVIDER_SITE_OTHER): Payer: Commercial Managed Care - HMO | Admitting: Nurse Practitioner

## 2021-12-18 VITALS — BP 130/90 | HR 78 | Temp 97.0°F | Resp 14 | Ht 64.0 in | Wt 297.5 lb

## 2021-12-18 DIAGNOSIS — F419 Anxiety disorder, unspecified: Secondary | ICD-10-CM

## 2021-12-18 DIAGNOSIS — R0789 Other chest pain: Secondary | ICD-10-CM | POA: Diagnosis not present

## 2021-12-18 DIAGNOSIS — F32A Depression, unspecified: Secondary | ICD-10-CM

## 2021-12-18 DIAGNOSIS — R0982 Postnasal drip: Secondary | ICD-10-CM | POA: Diagnosis not present

## 2021-12-18 DIAGNOSIS — I10 Essential (primary) hypertension: Secondary | ICD-10-CM | POA: Diagnosis not present

## 2021-12-18 MED ORDER — LABETALOL HCL 100 MG PO TABS: 100.0000 mg | ORAL_TABLET | Freq: Two times a day (BID) | ORAL | 1 refills | Status: DC

## 2021-12-18 MED ORDER — IPRATROPIUM BROMIDE 0.03 % NA SOLN: 2.0000 | Freq: Three times a day (TID) | NASAL | 0 refills | Status: DC | PRN

## 2021-12-18 NOTE — Progress Notes (Signed)
Established Patient Office Visit  Subjective   Patient ID: Alisha Beltran, female    DOB: Jul 01, 1980  Age: 41 y.o. MRN: [id number]  Chief Complaint  Patient presents with   Depression    Follow up-doing ok, realizes that things are situational issues and has learned to manage that.    Hypertension    Follow up      HTN: patient is currently maintained on labetalol 100mg . SHe does have a blood pressure cuff at home. She is checking blood pressure at home zero times a week and her readings have been  States that she is working 10-12 hours 5 days a week and 8 ours on Saturday.  Depression: states that mood is been doing better. States that she has thought about it it has been situational. 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 States that she has been reliquishing some of those responsibilities.    Chest tightness: happens at random times. Described as a chest tightness. Will last most 20 mins. Will resolve on its own. Has a high stress profession. No other accompanying symptoms like pain radiation, shob, diaphoresis, nausea or vomiting     07/10/2021   12:48 PM 12/30/2019   12:53 PM 12/30/2019   12:28 PM  PHQ9 SCORE ONLY  PHQ-9 Total Score 15 5 2        07/10/2021   12:49 PM  GAD 7 : Generalized Anxiety Score  Nervous, Anxious, on Edge 3  Control/stop worrying 1  Worry too much - different things 2  Trouble relaxing 1  Restless 0  Easily annoyed or irritable 3  Afraid - awful might happen 0  Total GAD 7 Score 10        Review of Systems  Respiratory:  Negative for shortness of breath.   Cardiovascular:  Positive for chest pain and leg swelling.  Psychiatric/Behavioral:  Positive for depression.       Objective:     BP (!) 130/90   Pulse 78   Temp (!) 97 F (36.1 C)   Resp 14   Ht 5\' 4"  (1.626 m)   Wt 297 lb 8 oz (134.9 kg)   SpO2 100%   BMI 51.07 kg/m  BP Readings from Last 3 Encounters:  12/18/21 (!) 130/90  10/09/21  (!) 130/92  08/10/21 (!) 180/110       Physical Exam Vitals and nursing note reviewed.  Constitutional:      Appearance: Normal appearance.  Cardiovascular:     Rate and Rhythm: Normal rate and regular rhythm.     Heart sounds: Normal heart sounds.  Pulmonary:     Effort: Pulmonary effort is normal.     Breath sounds: Normal breath sounds.  Musculoskeletal:     Right lower leg: Edema present.     Left lower leg: Edema present.  Neurological:     Mental Status: She is alert.      No results found for any visits on 12/18/21.    The 10-year ASCVD risk score (Arnett DK, et al., 2019) is: 2.6%    Assessment & Plan:   Problem List Items Addressed This Visit       Cardiovascular and Mediastinum   Essential hypertension    Patient currently taking labetalol 100 mg twice daily.  Blood pressure elevated at beginning of office visit slightly elevated at recheck.  Patient has lost a small amount of weight since being this year continue work on lifestyle modifications we will  make no regimen changes currently.      Relevant Medications   labetalol (NORMODYNE) 100 MG tablet     Other   Atypical chest pain    History of the same likely related to stress and anxiety.  Did review previous EKG which was within normal limits.  Told patient signs and symptoms watch out for and when to alert the office.  Can always refer to cardiology if need be.      Anxiety and depression    Patient feels this is situational.  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  PHQ-9 and GAD-7 administered today      Other Visit Diagnoses     PND (post-nasal drip)    -  Primary   Relevant Medications   ipratropium (ATROVENT) 0.03 % nasal spray       Return in about 3 months (around 03/20/2022) for CPE.    Audria Nine, NP

## 2021-12-18 NOTE — Assessment & Plan Note (Signed)
Patient currently taking labetalol 100 mg twice daily.  Blood pressure elevated at beginning of office visit slightly elevated at recheck.  Patient has lost a small amount of weight since being this year continue work on lifestyle modifications we will make no regimen changes currently.

## 2021-12-18 NOTE — Assessment & Plan Note (Signed)
History of the same likely related to stress and anxiety.  Did review previous EKG which was within normal limits.  Told patient signs and symptoms watch out for and when to alert the office.  Can always refer to cardiology if need be.

## 2021-12-18 NOTE — Assessment & Plan Note (Signed)
Patient feels this is situational.  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  PHQ-9 and GAD-7 administered today

## 2021-12-18 NOTE — Patient Instructions (Addendum)
Nice to see you today I want to see you in 3 months for your physical and Labs Check blood pressure a couple times a week for me at home Follow up sooner if you need me

## 2021-12-25 ENCOUNTER — Ambulatory Visit (INDEPENDENT_AMBULATORY_CARE_PROVIDER_SITE_OTHER): Payer: Commercial Managed Care - HMO | Admitting: Family Medicine

## 2021-12-25 ENCOUNTER — Encounter: Payer: Self-pay | Admitting: Family Medicine

## 2021-12-25 VITALS — BP 138/82 | HR 71 | Temp 97.5°F | Ht 64.0 in | Wt 297.4 lb

## 2021-12-25 DIAGNOSIS — R197 Diarrhea, unspecified: Secondary | ICD-10-CM | POA: Diagnosis not present

## 2021-12-25 DIAGNOSIS — R1030 Lower abdominal pain, unspecified: Secondary | ICD-10-CM | POA: Insufficient documentation

## 2021-12-25 DIAGNOSIS — D509 Iron deficiency anemia, unspecified: Secondary | ICD-10-CM

## 2021-12-25 DIAGNOSIS — R110 Nausea: Secondary | ICD-10-CM

## 2021-12-25 DIAGNOSIS — E538 Deficiency of other specified B group vitamins: Secondary | ICD-10-CM

## 2021-12-25 DIAGNOSIS — E559 Vitamin D deficiency, unspecified: Secondary | ICD-10-CM

## 2021-12-25 LAB — COMPREHENSIVE METABOLIC PANEL
ALT: 7 U/L (ref 0–35)
AST: 12 U/L (ref 0–37)
Albumin: 4.1 g/dL (ref 3.5–5.2)
Alkaline Phosphatase: 66 U/L (ref 39–117)
BUN: 8 mg/dL (ref 6–23)
CO2: 22 mEq/L (ref 19–32)
Calcium: 9.1 mg/dL (ref 8.4–10.5)
Chloride: 107 mEq/L (ref 96–112)
Creatinine, Ser: 0.69 mg/dL (ref 0.40–1.20)
GFR: 107.22 mL/min (ref >60.00)
Glucose, Bld: 101 mg/dL — ABNORMAL HIGH (ref 70–99)
Potassium: 4.1 mEq/L (ref 3.5–5.1)
Sodium: 138 mEq/L (ref 135–145)
Total Bilirubin: 0.4 mg/dL (ref 0.2–1.2)
Total Protein: 7 g/dL (ref 6.0–8.3)

## 2021-12-25 LAB — CBC WITH DIFFERENTIAL/PLATELET
Basophils Absolute: 0.1 10*3/uL (ref 0.0–0.1)
Basophils Relative: 0.7 % (ref 0.0–3.0)
Eosinophils Absolute: 0.1 10*3/uL (ref 0.0–0.7)
Eosinophils Relative: 1.2 % (ref 0.0–5.0)
HCT: 34.5 % — ABNORMAL LOW (ref 36.0–46.0)
Hemoglobin: 11.2 g/dL — ABNORMAL LOW (ref 12.0–15.0)
Lymphocytes Relative: 17.3 % (ref 12.0–46.0)
Lymphs Abs: 1.3 10*3/uL (ref 0.7–4.0)
MCHC: 32.4 g/dL (ref 30.0–36.0)
MCV: 81.8 fl (ref 78.0–100.0)
Monocytes Absolute: 0.3 10*3/uL (ref 0.1–1.0)
Monocytes Relative: 3.7 % (ref 3.0–12.0)
Neutro Abs: 5.8 10*3/uL (ref 1.4–7.7)
Neutrophils Relative %: 77.1 % — ABNORMAL HIGH (ref 43.0–77.0)
Platelets: 447 10*3/uL — ABNORMAL HIGH (ref 150.0–400.0)
RBC: 4.22 Mil/uL (ref 3.87–5.11)
RDW: 15.6 % — ABNORMAL HIGH (ref 11.5–15.5)
WBC: 7.5 10*3/uL (ref 4.0–10.5)

## 2021-12-25 LAB — LIPASE: Lipase: 9 U/L — ABNORMAL LOW (ref 11.0–59.0)

## 2021-12-25 MED ORDER — ONDANSETRON HCL 4 MG PO TABS: 4.0000 mg | ORAL_TABLET | Freq: Three times a day (TID) | ORAL | 0 refills | Status: DC | PRN

## 2021-12-25 NOTE — Assessment & Plan Note (Signed)
Suggested restart oral B12 540mcg daily.

## 2021-12-25 NOTE — Assessment & Plan Note (Addendum)
Lower abd cramping that improves with defecation associated with nausea and diarrhea, episodes seem to recur every 3-4 months.  IBS- alternating diarrhea/constipation vs viral gastroenteritis, r/o celiac or other.  In abd pain, and h/o IDA, check labs today including celiac testing.  rec start [place] or align probiotic.  Rx zofran PRN nausea.  Update if not improving with treatment. Could consider SNRI for possible IBS, vs trial low-FODMAP diet.

## 2021-12-25 NOTE — Assessment & Plan Note (Signed)
Intolerant to oral iron due to hot flashes.  Consider iron infusion.

## 2021-12-25 NOTE — Progress Notes (Signed)
Patient ID: Alisha Beltran, female    DOB: 1981-01-15, 41 y.o.   MRN: [id number]  This visit was conducted in person.  BP 138/82   Pulse 71   Temp (!) 97.5 F (36.4 C) (Temporal)   Ht 5\' 4"  (1.626 m)   Wt 297 lb 6 oz (134.9 kg)   LMP 12/23/2021   SpO2 98%   BMI 51.04 kg/m   BP Readings from Last 3 Encounters:  12/25/21 138/82  12/18/21 (!) 130/90  10/09/21 (!) 130/92    CC: nausea, diarrhea  Subjective:   HPI: Alisha Beltran is a 41 y.o. female presenting on 12/25/2021 for Nausea (C/o nausea, diarrhea and lower abd pain.  Pain feels like cramps. Denie vomiting. Sxs started this morning.  H/o same sxs happening every 3-4 mo. )   1d h/o lower abdominal pain described as cramping associated with nausea and diarrhea. Cramping does improve after bowel movements.  No new foods, vitamins, supplements.  She had pasta last night.  She already follows lactose restricted diet - drinks almond milk, only occ butter and cheese Husband felt sick yesterday (GI illness as well).  Never been told she has IBS.  Notes h/o chronic constipation.  Notes increased work stress.  No fevers/chills, vomiting, blood in stool, UTI symptoms, dysuria. No epigastric pain.   These symptoms tend to happen once every 3-4 months, lasts 1-2 days and then symptoms resolve. No noted triggers. She does note increased stressors - working overtime, also running business.   BP elevated today - she did forget to take morning labetalol - just took it.  Improved on recheck.  LMP: 12/23/2021 currently on period. Normally regular.  GYN - Dr Radene Knee, last seen 09/2021.   H/o low b12, D, iron - oral iron caused hot flashes so she stopped this.      Relevant past medical, surgical, family and social history reviewed and updated as indicated. Interim medical history since our last visit reviewed. Allergies and medications reviewed and updated. Outpatient Medications Prior to Visit  Medication Sig Dispense Refill    acetaminophen (TYLENOL) 500 MG tablet Take 500-1,000 mg by mouth every 6 (six) hours as needed for moderate pain.      ibuprofen (ADVIL) 200 MG tablet Take 200 mg by mouth every 6 (six) hours as needed.     ipratropium (ATROVENT) 0.03 % nasal spray Place 2 sprays into both nostrils 3 (three) times daily as needed for rhinitis. 30 mL 0   labetalol (NORMODYNE) 100 MG tablet Take 1 tablet (100 mg total) by mouth 2 (two) times daily. 180 tablet 1   buPROPion (WELLBUTRIN XL) 150 MG 24 hr tablet Take 1 tablet (150 mg total) by mouth daily. 90 tablet 0   cetirizine (ZYRTEC ALLERGY) 10 MG tablet Take 1 tablet (10 mg total) by mouth daily. 30 tablet 2   No facility-administered medications prior to visit.     Per HPI unless specifically indicated in ROS section below Review of Systems  Objective:  BP 138/82   Pulse 71   Temp (!) 97.5 F (36.4 C) (Temporal)   Ht 5\' 4"  (1.626 m)   Wt 297 lb 6 oz (134.9 kg)   LMP 12/23/2021   SpO2 98%   BMI 51.04 kg/m   Wt Readings from Last 3 Encounters:  12/25/21 297 lb 6 oz (134.9 kg)  12/18/21 297 lb 8 oz (134.9 kg)  10/09/21 (!) 300 lb 6 oz (136.2 kg)      Physical Exam Vitals  and nursing note reviewed.  Constitutional:      Appearance: Normal appearance. She is obese. She is not ill-appearing.  HENT:     Mouth/Throat:     Comments: Wearing mask Eyes:     Extraocular Movements: Extraocular movements intact.     Conjunctiva/sclera: Conjunctivae normal.     Pupils: Pupils are equal, round, and reactive to light.  Neck:     Thyroid: No thyroid mass or thyromegaly.  Cardiovascular:     Rate and Rhythm: Normal rate and regular rhythm.     Pulses: Normal pulses.     Heart sounds: Normal heart sounds. No murmur heard. Pulmonary:     Effort: Pulmonary effort is normal. No respiratory distress.     Breath sounds: Normal breath sounds. No wheezing, rhonchi or rales.  Abdominal:     General: Bowel sounds are normal. There is no distension.      Palpations: Abdomen is soft. There is no mass.     Tenderness: There is no abdominal tenderness. There is no guarding or rebound.     Hernia: No hernia is present.  Musculoskeletal:     Right lower leg: No edema.     Left lower leg: No edema.  Skin:    General: Skin is warm and dry.     Findings: No rash.  Neurological:     Mental Status: She is alert.  Psychiatric:        Mood and Affect: Mood normal.        Behavior: Behavior normal.       Results for orders placed or performed in visit on 11/02/21  CBC and differential  Result Value Ref Range   Hemoglobin 10.6 (A) 12.0 - 16.0  HM PAP SMEAR  Result Value Ref Range   HM Pap smear negative   Results Console HPV  Result Value Ref Range   CHL HPV Negative    Lab Results  Component Value Date   TSH 2.52 07/10/2021    Lab Results  Component Value Date   WBC 7.3 07/10/2021   HGB 10.6 (A) 10/09/2021   HCT 34.1 (L) 07/10/2021   MCV 78.0 07/10/2021   PLT 497.0 (H) 07/10/2021    Assessment & Plan:   Problem List Items Addressed This Visit     Vitamin D deficiency    Suggested restart oral vit D3.       Lower abdominal pain - Primary    Lower abd cramping that improves with defecation associated with nausea and diarrhea, episodes seem to recur every 3-4 months.  IBS- alternating diarrhea/constipation vs viral gastroenteritis, r/o celiac or other.  In abd pain, and h/o IDA, check labs today including celiac testing.  rec start [place] or align probiotic.  Rx zofran PRN nausea.  Update if not improving with treatment. Could consider SNRI for possible IBS, vs trial low-FODMAP diet.       Relevant Orders   Celiac Pnl 2 rflx Endomysial Ab Ttr   CBC with Differential/Platelet   Comprehensive metabolic panel   Lipase   Low serum vitamin B12    Suggested restart oral B12 daily.       Relevant Orders   Celiac Pnl 2 rflx Endomysial Ab Ttr   Iron deficiency anemia    Intolerant to oral iron due to hot flashes.   Consider iron infusion.       Relevant Orders   Celiac Pnl 2 rflx Endomysial Ab Ttr   Other Visit Diagnoses  Nausea       Diarrhea, unspecified type       Relevant Orders   Celiac Pnl 2 rflx Endomysial Ab Ttr        Meds ordered this encounter  Medications   ondansetron (ZOFRAN) 4 MG tablet    Sig: Take 1 tablet (4 mg total) by mouth every 8 (eight) hours as needed for nausea or vomiting.    Dispense:  20 tablet    Refill:  0   Orders Placed This Encounter  Procedures   Celiac Pnl 2 rflx Endomysial Ab Ttr   CBC with Differential/Platelet   Comprehensive metabolic panel   Lipase     Patient instructions: Labs today  Possible viral gastroenteritis vs irritable bowel syndrome.  Once feeling better, increase fiber and water.  Exclude gas producing foods (beans, onions, celery, carrots, raisins, bananas, apricots, prunes, brussel sprouts, wheat germ, pretzels) Consider trail of lactose free diet (back off milk) Trial of align for bloating/IBS symptoms (OTC). Or trial of activia for constipation.   Follow up plan: No follow-ups on file.  Eustaquio Boyden, MD

## 2021-12-25 NOTE — Assessment & Plan Note (Signed)
Suggested restart oral vit D3.

## 2021-12-25 NOTE — Patient Instructions (Addendum)
Labs today  Possible viral gastroenteritis vs irritable bowel syndrome.  Once feeling better, increase fiber and water.  Exclude gas producing foods (beans, onions, celery, carrots, raisins, bananas, apricots, prunes, brussel sprouts, wheat germ, pretzels) Consider trail of lactose free diet (back off milk) Trial of align for bloating/IBS symptoms (OTC). Or trial of activia for constipation.   Irritable Bowel Syndrome, Adult  Irritable bowel syndrome (IBS) is a group of symptoms that affects the organs responsible for digestion (gastrointestinal tract, or GI tract). IBS is not one specific disease. To regulate how the GI tract works, the body sends signals back and forth between the intestines and the brain. If you have IBS, there may be a problem with these signals. As a result, the GI tract does not function normally. The intestines may become more sensitive and overreact to certain things. This may be especially true when you eat certain foods or when you are under stress. There are four main types of IBS. These may be determined based on the consistency of your stool (feces): IBS with mostly (predominance of) diarrhea. IBS with predominance of constipation. IBS with mixed bowel habits. This includes both diarrhea and constipation. IBS unclassified. This includes IBS that cannot be categorized into one of the other three main types. It is important to know which type of IBS you have. Certain treatments are more likely to be helpful for certain types of IBS. What are the causes? The exact cause of IBS is not known. What increases the risk? You may have a higher risk for IBS if you: Are female. Are younger than 40 years. Have a family history of IBS. Have a mental health condition, such as depression, anxiety, or post-traumatic stress disorder. Have had a bacterial infection of your GI tract. What are the signs or symptoms? Symptoms of IBS vary from person to person. The main symptom is  abdominal pain or discomfort. Other symptoms usually include one or more of the following: Diarrhea, constipation, or both. Swelling or bloating in the abdomen. Feeling full after eating a small or regular-sized meal. Frequent gas. Mucus in the stool. A feeling of having more stool left after a bowel movement. Symptoms tend to come and go. They may be triggered by stress, mental health conditions, or certain foods. How is this diagnosed? This condition may be diagnosed based on a physical exam, your medical history, and your symptoms. You may have tests, such as: Blood tests. Stool test. Colonoscopy. This is a procedure in which your GI tract is viewed with a long, thin, flexible tube. How is this treated? There is no cure for IBS, but treatment can help relieve symptoms. Treatment depends on the type of IBS you have, and may include: Changes to your diet, such as: Avoiding foods that cause symptoms. Drinking more water. Following a low-FODMAP (fermentable oligosaccharides, disaccharides, monosaccharides, and polyols) diet for up to 6 weeks, or as told by your health care provider. FODMAPs are sugars that are hard for some people to digest. Eating more fiber. Eating small meals at the same times every day. Medicines. These may include: Fiber supplements, if you have constipation. Medicine to control diarrhea (antidiarrheal medicines). Medicine to help control muscle tightening (spasms) in your GI tract (antispasmodic medicines). Medicines to help with mental health conditions, such as antidepressants. Talk therapy or counseling. Working with a dietitian to help create a food plan that is right for you. Managing your stress. Follow these instructions at home: Eating and drinking  Eat a healthy  diet. Eat 5-6 small meals a day. Try to eat meals at about the same times each day. Do not eat large meals. Gradually eat more fiber-rich foods. These include whole grains, fruits, and  vegetables. This may be especially helpful if you have IBS with constipation. Eat a diet low in FODMAPs. You may need to avoid foods such as citrus fruits, cabbage, garlic, and onions. Drink enough fluid to keep your urine pale yellow. Keep a journal of foods that seem to trigger symptoms. Avoid foods and drinks that: Contain added sugar. Make your symptoms worse. These may include dairy products, caffeinated drinks, and carbonated drinks. Alcohol use Do not drink alcohol if: Your health care provider tells you not to drink. You are pregnant, may be pregnant, or are planning to become pregnant. If you drink alcohol: Limit how much you have to: 0-1 drink a day for women. 0-2 drinks a day for men. Know how much alcohol is in your drink. In the [place] one drink equals one 12 oz bottle of beer (355 mL), one 5 oz glass of wine (148 mL), or one 1 oz glass of hard liquor (44 mL) General instructions Take over-the-counter and prescription medicines only as told by your health care provider. This includes supplements. Get enough exercise. Do at least 150 minutes of moderate-intensity exercise each week. Manage your stress. Getting enough sleep and exercise can help you manage stress. Keep all follow-up visits. This is important. This includes all visits with your health care provider and therapist. Where to find more information International Foundation for Functional Gastrointestinal Disorders: aboutibs.[employer] of Diabetes and Digestive and Kidney Diseases: [employer].is Contact a health care provider if: You have constant pain. You lose weight. You have diarrhea that gets worse. You have bleeding from the rectum. You vomit often. You have a fever. Get help right away if: You have severe abdominal pain. You have diarrhea with symptoms of dehydration, such as dizziness or dry mouth. You have bloody or black stools. You have severe abdominal bloating. You have vomiting that  does not stop. You have blood in your vomit. Summary Irritable bowel syndrome (IBS) is not one specific disease. It is a group of symptoms that affects digestion. Your intestines may become more sensitive and overreact to certain things. This may be especially true when you eat certain foods or when you are under stress. There is no cure for IBS, but treatment can help relieve symptoms. This information is not intended to replace advice given to you by your health care provider. Make sure you discuss any questions you have with your health care provider. Document Revised: 02/08/2021 Document Reviewed: 02/08/2021 Elsevier Patient Education  [place].

## 2021-12-26 ENCOUNTER — Encounter: Payer: Self-pay | Admitting: Nurse Practitioner

## 2021-12-27 NOTE — Telephone Encounter (Signed)
See mychart question

## 2021-12-27 NOTE — Progress Notes (Deleted)
    Alisha Waheed T. Dell Hurtubise, MD, [place] [facility] [place], [address]  Phone: [phone]  FAX: [place] - 41 y.o. female  MRN [id number]  Date of Birth: 03/14/1980  Date: 12/28/2021  PCP: Michela Pitcher, NP  Referral: Michela Pitcher, NP  No chief complaint on file.  Subjective:   Alisha Beltran is a 41 y.o. very pleasant female patient with There is no height or weight on file to calculate BMI. who presents with the following:  Pleasant young lady who presents with some left-sided acute foot pain.  Review of Systems is noted in the HPI, as appropriate  Objective:   LMP 12/23/2021   GEN: No acute distress; alert,appropriate. PULM: Breathing comfortably in no respiratory distress PSYCH: Normally interactive.   Laboratory and Imaging Data:  Assessment and Plan:   ***

## 2021-12-28 ENCOUNTER — Ambulatory Visit: Payer: Commercial Managed Care - HMO | Admitting: Family Medicine

## 2021-12-28 DIAGNOSIS — M79672 Pain in left foot: Secondary | ICD-10-CM

## 2022-01-03 LAB — CELIAC PNL 2 RFLX ENDOMYSIAL AB TTR
(tTG) Ab, IgA: <1 U/mL
(tTG) Ab, IgG: <1 U/mL
Endomysial Ab IgA: NEGATIVE
Gliadin IgA: <1 U/mL
Gliadin IgG: <1 U/mL
Immunoglobulin A: 73 mg/dL (ref 47–310)

## 2022-01-23 ENCOUNTER — Ambulatory Visit
Admission: EM | Admit: 2022-01-23 | Discharge: 2022-01-23 | Disposition: A | Discharge status: Home / Self Care | Payer: Commercial Managed Care - HMO | Attending: Internal Medicine | Admitting: Internal Medicine

## 2022-01-23 DIAGNOSIS — J069 Acute upper respiratory infection, unspecified: Secondary | ICD-10-CM | POA: Diagnosis not present

## 2022-01-23 DIAGNOSIS — R030 Elevated blood-pressure reading, without diagnosis of hypertension: Secondary | ICD-10-CM

## 2022-01-23 MED ORDER — FLUTICASONE PROPIONATE 50 MCG/ACT NA SUSP: 1.0000 | Freq: Every day | NASAL | 0 refills | Status: DC

## 2022-01-23 MED ORDER — BENZONATATE 100 MG PO CAPS: 100.0000 mg | ORAL_CAPSULE | Freq: Three times a day (TID) | ORAL | 0 refills | Status: DC | PRN

## 2022-01-23 MED ORDER — CETIRIZINE HCL 10 MG PO TABS: 10.0000 mg | ORAL_TABLET | Freq: Every day | ORAL | 0 refills | Status: DC

## 2022-01-23 MED ORDER — ALBUTEROL SULFATE HFA 108 (90 BASE) MCG/ACT IN AERS: 1.0000 | INHALATION_SPRAY | Freq: Four times a day (QID) | RESPIRATORY_TRACT | 0 refills | Status: DC | PRN

## 2022-01-23 NOTE — ED Triage Notes (Signed)
Pt presents to [facility] with co of sore throat, cough, congestion, ha, sinus pressure and hoarseness for 4 days. Pt reports attempting musinex with minimal improvement.

## 2022-01-23 NOTE — Discharge Instructions (Signed)
You have a viral upper respiratory infection that should self resolve with symptomatic treatment.  I have prescribed you four different medications to help alleviate symptoms including an inhaler which you will use as needed for shortness of breath.  Please follow-up if any symptoms persist or worsen.

## 2022-01-23 NOTE — ED Provider Notes (Signed)
[name]    CSN: [id number] Arrival date & time: 01/23/22  1619      History   Chief Complaint Chief Complaint  Patient presents with   Cough   Nasal Congestion    HPI Alisha Beltran is a 41 y.o. female.   Patient presents with sore throat, nasal congestion, sinus pressure, cough that has been present for about 4 days.  Patient also reports that she has intermittent shortness of breath with exertion.  She reports that this is not baseline for her and denies history of asthma.  She denies chest pain, ear pain, nausea, vomiting, diarrhea, abdominal pain.  Patient has taken Mucinex with minimal improvement in symptoms.  Patient also has elevated blood pressure reading and reports that she has been taking her blood pressure medication labetalol as prescribed.  Denies any associated blurred vision or headache.    Cough   Past Medical History:  Diagnosis Date   Allergy    Hypertension     Patient Active Problem List   Diagnosis Date Noted   Lower abdominal pain 12/25/2021   Low serum vitamin B12 12/25/2021   Iron deficiency anemia 12/25/2021   Acute left ankle pain 07/10/2021   Blood in stool 07/10/2021   Atypical chest pain 07/10/2021   Abnormal CBC 07/10/2021   Anxiety and depression 07/10/2021   Vitamin D deficiency 07/10/2021   Chronic idiopathic constipation 12/30/2019   Seasonal allergic rhinitis 12/30/2019   Morbid obesity with BMI of 50.0-59.9, adult (HCC) 08/14/2017   Essential hypertension 08/14/2017    Past Surgical History:  Procedure Laterality Date   NO PAST SURGERIES      OB History   No obstetric history on file.      Home Medications    Prior to Admission medications   Medication Sig Start Date End Date Taking? Authorizing Provider  albuterol (VENTOLIN HFA) 108 (90 Base) MCG/ACT inhaler Inhale 1-2 puffs into the lungs every 6 (six) hours as needed for wheezing or shortness of breath. 01/23/22  Yes Moris Ratchford, Rolly Salter E, FNP   benzonatate (TESSALON) 100 MG capsule Take 1 capsule (100 mg total) by mouth every 8 (eight) hours as needed for cough. 01/23/22  Yes Eloyce Bultman, Acie Fredrickson, FNP  cetirizine (ZYRTEC) 10 MG tablet Take 1 tablet (10 mg total) by mouth daily. 01/23/22  Yes Brooklinn Longbottom, Rolly Salter E, FNP  fluticasone (FLONASE) 50 MCG/ACT nasal spray Place 1 spray into both nostrils daily. 01/23/22  Yes Shonia Skilling, Rolly Salter E, FNP  acetaminophen (TYLENOL) 500 MG tablet Take 500-1,000 mg by mouth every 6 (six) hours as needed for moderate pain.     [provider]  ibuprofen (ADVIL) 200 MG tablet Take 200 mg by mouth every 6 (six) hours as needed.    [provider]  ipratropium (ATROVENT) 0.03 % nasal spray Place 2 sprays into both nostrils 3 (three) times daily as needed for rhinitis. 12/18/21   Eden Emms, NP  labetalol (NORMODYNE) 100 MG tablet Take 1 tablet (100 mg total) by mouth 2 (two) times daily. 12/18/21   Eden Emms, NP  ondansetron (ZOFRAN) 4 MG tablet Take 1 tablet (4 mg total) by mouth every 8 (eight) hours as needed for nausea or vomiting. 12/25/21   Eustaquio Boyden, MD    Family History Family History  Problem Relation Age of Onset   Cancer Mother    Early death Mother 5   Heart attack Mother    Hypertension Father     Social History Social History  Tobacco Use   Smoking status: Never   Smokeless tobacco: Never  Vaping Use   Vaping Use: Never used  Substance Use Topics   Alcohol use: No   Drug use: Never     Allergies   Other, Amlodipine, Dust mite extract, Iron, and Sertraline hcl   Review of Systems Review of Systems Per HPI  Physical Exam Triage Vital Signs ED Triage Vitals  Enc Vitals Group     BP 01/23/22 1656 (!) 174/79     Pulse Rate 01/23/22 1656 72     Resp 01/23/22 1656 19     Temp 01/23/22 1656 98.2 F (36.8 C)     Temp src --      SpO2 01/23/22 1656 98 %     Weight --      Height --      Head Circumference --      Peak Flow --      Pain Score 01/23/22  1654 0     Pain Loc --      Pain Edu? --      Excl. in GC? --    No data found.  Updated Vital Signs BP (!) 174/79   Pulse 72   Temp 98.2 F (36.8 C)   Resp 19   LMP 01/16/2022 (Exact Date)   SpO2 98%   Visual Acuity Right Eye Distance:   Left Eye Distance:   Bilateral Distance:    Right Eye Near:   Left Eye Near:    Bilateral Near:     Physical Exam Constitutional:      General: She is not in acute distress.    Appearance: Normal appearance. She is not toxic-appearing or diaphoretic.  HENT:     Head: Normocephalic and atraumatic.     Right Ear: Tympanic membrane and ear canal normal.     Left Ear: Tympanic membrane and ear canal normal.     Nose: Congestion present.     Mouth/Throat:     Mouth: Mucous membranes are moist.     Pharynx: No posterior oropharyngeal erythema.  Eyes:     Extraocular Movements: Extraocular movements intact.     Conjunctiva/sclera: Conjunctivae normal.     Pupils: Pupils are equal, round, and reactive to light.  Cardiovascular:     Rate and Rhythm: Normal rate and regular rhythm.     Pulses: Normal pulses.     Heart sounds: Normal heart sounds.  Pulmonary:     Effort: Pulmonary effort is normal. No respiratory distress.     Breath sounds: Normal breath sounds. No stridor. No wheezing, rhonchi or rales.  Abdominal:     General: Abdomen is flat. Bowel sounds are normal.     Palpations: Abdomen is soft.  Musculoskeletal:        General: Normal range of motion.     Cervical back: Normal range of motion.  Skin:    General: Skin is warm and dry.  Neurological:     General: No focal deficit present.     Mental Status: She is alert and oriented to person, place, and time. Mental status is at baseline.     Cranial Nerves: Cranial nerves 2-12 are intact.     Sensory: Sensation is intact.     Motor: Motor function is intact.     Coordination: Coordination is intact.     Gait: Gait is intact.  Psychiatric:        Mood and Affect: Mood  normal.  Behavior: Behavior normal.      UC Treatments / Results  Labs (all labs ordered are listed, but only abnormal results are displayed) Labs Reviewed - No data to display  EKG   Radiology No results found.  Procedures Procedures (including critical care time)  Medications Ordered in UC Medications - No data to display  Initial Impression / Assessment and Plan / UC Course  I have reviewed the triage vital signs and the nursing notes.  Pertinent labs & imaging results that were available during my care of the patient were reviewed by me and considered in my medical decision making (see chart for details).     Patient presents with symptoms likely from a viral upper respiratory infection. Differential includes bacterial pneumonia, sinusitis, allergic rhinitis, COVID-19, flu, RSV. Do not suspect underlying cardiopulmonary process. Symptoms seem unlikely related to ACS, CHF or COPD exacerbations, pneumonia, pneumothorax. Patient is nontoxic appearing and not in need of emergent medical intervention.  Offered patient viral testing but she declined.  No adventitious lung sounds on exam and no signs of respiratory compromise so do not think that chest imaging is necessary.  Suspect intermittent shortness of breath is due to inflammation due to viral illness.  Will treat with albuterol. Patient does take labetalol which may decrease the effect of albuterol but I do think benefits outweigh risks.  Recommended symptom control with over the counter medications that are safe with hypertension.  Discussed supportive care and symptom management.  Patient sent prescriptions to alleviate symptoms.  Patient also has elevated blood pressure reading with recheck being similar.  Patient reports that her blood pressure at home is typically 140's systolic and she has been taking her blood pressure medication as prescribed.  Advised patient to monitor at home with home blood pressure cuff very  closely and to follow-up with PCP or urgent care if remains elevated.  No signs of hypertensive urgency on exam.  Neuro exam is normal and no signs of endorgan damage.  Return if symptoms fail to improve. Strict ER and return precautions given. Patient states understanding and is agreeable.  Discharged with PCP followup.  Final Clinical Impressions(s) / UC Diagnoses   Final diagnoses:  Viral upper respiratory tract infection with cough  Elevated blood pressure reading     Discharge Instructions      You have a viral upper respiratory infection that should self resolve with symptomatic treatment.  I have prescribed you four different medications to help alleviate symptoms including an inhaler which you will use as needed for shortness of breath.  Please follow-up if any symptoms persist or worsen.    ED Prescriptions     Medication Sig Dispense Auth. Provider   albuterol (VENTOLIN HFA) 108 (90 Base) MCG/ACT inhaler Inhale 1-2 puffs into the lungs every 6 (six) hours as needed for wheezing or shortness of breath. 1 each [place], Rolly Salter E, FNP   fluticasone [facility]) 50 MCG/ACT nasal spray Place 1 spray into both nostrils daily. 16 g Tedi Hughson, Rolly Salter E, [place]   benzonatate (TESSALON) 100 MG capsule Take 1 capsule (100 mg total) by mouth every 8 (eight) hours as needed for cough. 21 capsule Galt, [place] E, [place]   cetirizine (ZYRTEC) 10 MG tablet Take 1 tablet (10 mg total) by mouth daily. 30 tablet Blanchard, Acie Fredrickson, [place]      PDMP not reviewed this encounter.   Gustavus Bryant, [place] 01/23/22 1756

## 2022-01-29 ENCOUNTER — Ambulatory Visit
Admission: EM | Admit: 2022-01-29 | Discharge: 2022-01-29 | Disposition: A | Discharge status: Home / Self Care | Payer: Commercial Managed Care - HMO | Attending: Emergency Medicine | Admitting: Emergency Medicine

## 2022-01-29 DIAGNOSIS — Z792 Long term (current) use of antibiotics: Secondary | ICD-10-CM | POA: Insufficient documentation

## 2022-01-29 DIAGNOSIS — B9789 Other viral agents as the cause of diseases classified elsewhere: Secondary | ICD-10-CM | POA: Diagnosis present

## 2022-01-29 DIAGNOSIS — Z1152 Encounter for screening for COVID-19: Secondary | ICD-10-CM | POA: Insufficient documentation

## 2022-01-29 DIAGNOSIS — J22 Unspecified acute lower respiratory infection: Secondary | ICD-10-CM | POA: Insufficient documentation

## 2022-01-29 DIAGNOSIS — J988 Other specified respiratory disorders: Secondary | ICD-10-CM | POA: Insufficient documentation

## 2022-01-29 DIAGNOSIS — Z79899 Other long term (current) drug therapy: Secondary | ICD-10-CM | POA: Insufficient documentation

## 2022-01-29 DIAGNOSIS — B9689 Other specified bacterial agents as the cause of diseases classified elsewhere: Secondary | ICD-10-CM | POA: Diagnosis not present

## 2022-01-29 LAB — RESP PANEL BY RT-PCR (RSV, FLU A&B, COVID)  RVPGX2
Influenza A by PCR: NEGATIVE
Influenza B by PCR: NEGATIVE
Resp Syncytial Virus by PCR: NEGATIVE
SARS Coronavirus 2 by RT PCR: NEGATIVE

## 2022-01-29 MED ORDER — IBUPROFEN 400 MG PO TABS: 400.0000 mg | ORAL_TABLET | Freq: Three times a day (TID) | ORAL | 0 refills | Status: DC | PRN

## 2022-01-29 MED ORDER — AEROCHAMBER PLUS FLO-VU LARGE MISC: 1.0000 | Freq: Once | 0 refills | Status: AC

## 2022-01-29 MED ORDER — PROMETHAZINE-DM 6.25-15 MG/5ML PO SYRP: 5.0000 mL | ORAL_SOLUTION | Freq: Four times a day (QID) | ORAL | 0 refills | Status: DC | PRN

## 2022-01-29 MED ORDER — CEFDINIR 300 MG PO CAPS: 300.0000 mg | ORAL_CAPSULE | Freq: Two times a day (BID) | ORAL | 0 refills | Status: AC

## 2022-01-29 MED ORDER — CEFDINIR 300 MG PO CAPS: 300.0000 mg | ORAL_CAPSULE | Freq: Two times a day (BID) | ORAL | 0 refills | Status: DC

## 2022-01-29 MED ORDER — ALBUTEROL SULFATE HFA 108 (90 BASE) MCG/ACT IN AERS: 2.0000 | INHALATION_SPRAY | Freq: Four times a day (QID) | RESPIRATORY_TRACT | 0 refills | Status: DC | PRN

## 2022-01-29 MED ORDER — GUAIFENESIN 400 MG PO TABS: ORAL_TABLET | ORAL | 0 refills | Status: DC

## 2022-01-29 NOTE — ED Provider Notes (Signed)
Alisha Beltran    CSN: [id number] Arrival date & time: 01/29/22  1812    HISTORY   Chief Complaint  Patient presents with   Cough   HPI Alisha Beltran is a pleasant, 41 y.o. female who presents to urgent care today. Pt. Presents to UC w/ c/o a persistent cough for the past two weeks. Pt. Request RSV testing.   Cough  Past Medical History:  Diagnosis Date   Allergy    Hypertension    Patient Active Problem List   Diagnosis Date Noted   Lower abdominal pain 12/25/2021   Low serum vitamin B12 12/25/2021   Iron deficiency anemia 12/25/2021   Acute left ankle pain 07/10/2021   Blood in stool 07/10/2021   Atypical chest pain 07/10/2021   Abnormal CBC 07/10/2021   Anxiety and depression 07/10/2021   Vitamin D deficiency 07/10/2021   Chronic idiopathic constipation 12/30/2019   Seasonal allergic rhinitis 12/30/2019   Morbid obesity with BMI of 50.0-59.9, adult (HCC) 08/14/2017   Essential hypertension 08/14/2017   Past Surgical History:  Procedure Laterality Date   NO PAST SURGERIES     OB History   No obstetric history on file.    Home Medications    Prior to Admission medications   Medication Sig Start Date End Date Taking? Authorizing Provider  albuterol (VENTOLIN HFA) 108 (90 Base) MCG/ACT inhaler Inhale 2 puffs into the lungs every 6 (six) hours as needed for wheezing or shortness of breath (Cough). 01/29/22  Yes Theadora Rama Scales, PA-C  guaifenesin (HUMIBID E) 400 MG TABS tablet Take 1 tablet 3 times daily as needed for chest congestion and cough 01/29/22  Yes Theadora Rama Scales, PA-C  ibuprofen (ADVIL) 400 MG tablet Take 1 tablet (400 mg total) by mouth every 8 (eight) hours as needed for up to 30 doses. 01/29/22  Yes Theadora Rama Scales, PA-C  promethazine-dextromethorphan (PROMETHAZINE-DM) 6.25-15 MG/5ML syrup Take 5 mLs by mouth 4 (four) times daily as needed for cough. 01/29/22  Yes Theadora Rama Scales, PA-C  Spacer/Aero-Holding  Chambers (AEROCHAMBER PLUS FLO-VU LARGE) MISC 1 each by Other route once for 1 dose. 01/29/22 01/29/22 Yes Theadora Rama Scales, PA-C  acetaminophen (TYLENOL) 500 MG tablet Take 500-1,000 mg by mouth every 6 (six) hours as needed for moderate pain.     [provider]  cefdinir (OMNICEF) 300 MG capsule Take 1 capsule (300 mg total) by mouth 2 (two) times daily for 10 days. 01/29/22 02/08/22  Theadora Rama Scales, PA-C  cetirizine (ZYRTEC) 10 MG tablet Take 1 tablet (10 mg total) by mouth daily. 01/23/22   Gustavus Bryant, FNP  fluticasone (FLONASE) 50 MCG/ACT nasal spray Place 1 spray into both nostrils daily. 01/23/22   Gustavus Bryant, FNP  ipratropium (ATROVENT) 0.03 % nasal spray Place 2 sprays into both nostrils 3 (three) times daily as needed for rhinitis. 12/18/21   Eden Emms, NP  labetalol (NORMODYNE) 100 MG tablet Take 1 tablet (100 mg total) by mouth 2 (two) times daily. 12/18/21   Eden Emms, NP  ondansetron (ZOFRAN) 4 MG tablet Take 1 tablet (4 mg total) by mouth every 8 (eight) hours as needed for nausea or vomiting. 12/25/21   Eustaquio Boyden, MD    Family History Family History  Problem Relation Age of Onset   Cancer Mother    Early death Mother 69   Heart attack Mother    Hypertension Father    Social History Social History   Tobacco Use  Smoking status: Never   Smokeless tobacco: Never  Vaping Use   Vaping Use: Never used  Substance Use Topics   Alcohol use: No   Drug use: Never   Allergies   Other, Amlodipine, Dust mite extract, Iron, and Sertraline hcl  Review of Systems Review of Systems  Respiratory:  Positive for cough.    Pertinent findings revealed after performing a 14 point review of systems has been noted in the history of present illness.  Physical Exam Triage Vital Signs ED Triage Vitals  Enc Vitals Group     BP 01/06/21 0827 (!) 147/82     Pulse Rate 01/06/21 0827 72     Resp 01/06/21 0827 18     Temp 01/06/21 0827 98.3  F (36.8 C)     Temp Source 01/06/21 0827 Oral     SpO2 01/06/21 0827 98 %     Weight --      Height --      Head Circumference --      Peak Flow --      Pain Score 01/06/21 0826 5     Pain Loc --      Pain Edu? --      Excl. in GC? --   No data found.  Updated Vital Signs Pulse 80   Temp 98.1 F (36.7 C) (Oral)   Resp 18   LMP 01/16/2022 (Exact Date)   SpO2 98%   Physical Exam Pulmonary:     Breath sounds: Examination of the right-middle field reveals rales. Examination of the left-middle field reveals rales. Examination of the right-lower field reveals rales. Examination of the left-lower field reveals rales. Rales present.     Visual Acuity Right Eye Distance:   Left Eye Distance:   Bilateral Distance:    Right Eye Near:   Left Eye Near:    Bilateral Near:     UC Couse / Diagnostics / Procedures:     Radiology No results found.  Procedures Procedures (including critical care time) EKG  Pending results:  Labs Reviewed  RESP PANEL BY RT-PCR (RSV, FLU A&B, COVID)  [id number]    Medications Ordered in UC: Medications - No data to display  UC Diagnoses / Final Clinical Impressions(s)   I have reviewed the triage vital signs and the nursing notes.  Pertinent labs & imaging results that were available during my care of the patient were reviewed by me and considered in my medical decision making (see chart for details).    Final diagnoses:  Viral respiratory infection  Bacterial lower respiratory infection   ***  ED Prescriptions     Medication Sig Dispense Auth. Provider   promethazine-dextromethorphan (PROMETHAZINE-DM) 6.25-15 MG/5ML syrup Take 5 mLs by mouth 4 (four) times daily as needed for cough. 118 mL Theadora RamaMorgan, Jaanai Salemi Scales, PA-C   guaifenesin (HUMIBID E) 400 MG TABS tablet Take 1 tablet 3 times daily as needed for chest congestion and cough 21 tablet Theadora RamaMorgan, Anhad Sheeley Scales, PA-C   cefdinir (OMNICEF) 300 MG capsule  (Status: Discontinued) Take 1  capsule (300 mg total) by mouth 2 (two) times daily for 5 days. 10 capsule Theadora RamaMorgan, Jemaine Prokop Scales, PA-C   ibuprofen (ADVIL) 400 MG tablet Take 1 tablet (400 mg total) by mouth every 8 (eight) hours as needed for up to 30 doses. 30 tablet Theadora RamaMorgan, Lynne Takemoto Scales, PA-C   albuterol (VENTOLIN HFA) 108 (90 Base) MCG/ACT inhaler Inhale 2 puffs into the lungs every 6 (six) hours as needed for wheezing or shortness  of breath (Cough). 18 g Theadora Rama Scales, PA-C   Spacer/Aero-Holding Chambers (AEROCHAMBER PLUS FLO-VU LARGE) MISC 1 each by Other route once for 1 dose. 1 each Theadora Rama Scales, PA-C   cefdinir (OMNICEF) 300 MG capsule Take 1 capsule (300 mg total) by mouth 2 (two) times daily for 10 days. 20 capsule Theadora Rama Scales, PA-C      PDMP not reviewed this encounter.  Disposition Upon Discharge:  Condition: stable for discharge home Home: take medications as prescribed; routine discharge instructions as discussed; follow up as advised.  Patient presented with an acute illness with associated systemic symptoms and significant discomfort requiring urgent management. In my opinion, this is a condition that a prudent lay person (someone who possesses an average knowledge of health and medicine) may potentially expect to result in complications if not addressed urgently such as respiratory distress, impairment of bodily function or dysfunction of bodily organs.   Routine symptom specific, illness specific and/or disease specific instructions were discussed with the patient and/or caregiver at length.   As such, the patient has been evaluated and assessed, work-up was performed and treatment was provided in alignment with urgent care protocols and evidence based medicine.  Patient/parent/caregiver has been advised that the patient may require follow up for further testing and treatment if the symptoms continue in spite of treatment, as clinically indicated and appropriate.  If the  patient was tested for COVID-19, Influenza and/or RSV, then the patient/parent/guardian was advised to isolate at home pending the results of his/her diagnostic coronavirus test and potentially longer if they're positive. I have also advised pt that if his/her COVID-19 test returns positive, it's recommended to self-isolate for at least 10 days after symptoms first appeared AND until fever-free for 24 hours without fever reducer AND other symptoms have improved or resolved. Discussed self-isolation recommendations as well as instructions for household member/close contacts as per the [facility] and [place] DHHS, and also gave patient the COVID packet with this information.  Patient/parent/caregiver has been advised to return to the [facility] or PCP in 3-5 days if no better; to PCP or the Emergency Department if new signs and symptoms develop, or if the current signs or symptoms continue to change or worsen for further workup, evaluation and treatment as clinically indicated and appropriate  The patient will follow up with their current PCP if and as advised. If the patient does not currently have a PCP we will assist them in obtaining one.   The patient may need specialty follow up if the symptoms continue, in spite of conservative treatment and management, for further workup, evaluation, consultation and treatment as clinically indicated and appropriate.  Patient/parent/caregiver verbalized understanding and agreement of plan as discussed.  All questions were addressed during visit.  Please see discharge instructions below for further details of plan.  Discharge Instructions:   Discharge Instructions      Please read below to learn more about the medications, dosages and frequencies that I recommend to help alleviate your symptoms and to get you feeling better soon:   Omnicef (cefdinir):  1 capsule twice daily for 10 days, you can take it with or without food.  This antibiotic can cause upset stomach, this will  resolve once antibiotics are complete.  You are welcome to use a probiotic, eat yogurt, take Imodium while taking this medication.  Please avoid other systemic medications such as Maalox, Pepto-Bismol or milk of magnesia as they can interfere with your body's ability to absorb the antibiotics.  Zyrtec (cetirizine): This is an excellent second-generation antihistamine that helps to reduce respiratory inflammatory response to environmental allergens.  In some patients, this medication can cause daytime sleepiness so I recommend that you take 1 tablet daily at bedtime.     Flonase (fluticasone): This is a steroid nasal spray that you use once daily, 1 spray in each nare.  This medication does not work well if you decide to use it only used as you feel you need to, it works best used on a daily basis.  After 3 to 5 days of use, you will notice significant reduction of the inflammation and mucus production that is currently being caused by exposure to allergens, whether seasonal or environmental.  The most common side effect of this medication is nosebleeds.  If you experience a nosebleed, please discontinue use for 1 week, then feel free to resume.      ProAir, Ventolin, Proventil (albuterol): This inhaled medication contains a short acting beta agonist bronchodilator.  This medication works on the smooth muscle that opens and constricts of your airways by relaxing the muscle.  The result of relaxation of the smooth muscle is increased air movement and improved work of breathing.  This is a short acting medication that can be used every 4-6 hours as needed for increased work of breathing, shortness of breath, wheezing and excessive coughing.      Robitussin, Mucinex (guaifenesin): This is an expectorant.  This helps break up chest congestion and loosen up thick nasal drainage making phlegm and drainage more liquid and therefore easier to remove.  I recommend being 400 mg three times daily as needed.       Promethazine DM: Promethazine is both a nasal decongestant and an antinausea medication that makes most patients feel fairly sleepy.  The DM is dextromethorphan, a cough suppressant found in many over-the-counter cough medications.  Please take 5 mL before bedtime to minimize your cough which will help you sleep better.     Please follow-up within the next 5-7 days either with your primary care provider or urgent care if your symptoms do not resolve.  If you do not have a primary care provider, we will assist you in finding one.        Thank you for visiting urgent care today.  We appreciate the opportunity to participate in your care.       This office note has been dictated using Teaching laboratory technician.  Unfortunately, this method of dictation can sometimes lead to typographical or grammatical errors.  I apologize for your inconvenience in advance if this occurs.  Please do not hesitate to reach out to me if clarification is needed.

## 2022-01-29 NOTE — Discharge Instructions (Addendum)
Please read below to learn more about the medications, dosages and frequencies that I recommend to help alleviate your symptoms and to get you feeling better soon:   Omnicef (cefdinir):  1 capsule twice daily for 10 days, you can take it with or without food.  This antibiotic can cause upset stomach, this will resolve once antibiotics are complete.  You are welcome to use a probiotic, eat yogurt, take Imodium while taking this medication.  Please avoid other systemic medications such as Maalox, Pepto-Bismol or milk of magnesia as they can interfere with your body's ability to absorb the antibiotics.       Zyrtec (cetirizine): This is an excellent second-generation antihistamine that helps to reduce respiratory inflammatory response to environmental allergens.  In some patients, this medication can cause daytime sleepiness so I recommend that you take 1 tablet daily at bedtime.     Flonase (fluticasone): This is a steroid nasal spray that you use once daily, 1 spray in each nare.  This medication does not work well if you decide to use it only used as you feel you need to, it works best used on a daily basis.  After 3 to 5 days of use, you will notice significant reduction of the inflammation and mucus production that is currently being caused by exposure to allergens, whether seasonal or environmental.  The most common side effect of this medication is nosebleeds.  If you experience a nosebleed, please discontinue use for 1 week, then feel free to resume.      ProAir, Ventolin, Proventil (albuterol): This inhaled medication contains a short acting beta agonist bronchodilator.  This medication works on the smooth muscle that opens and constricts of your airways by relaxing the muscle.  The result of relaxation of the smooth muscle is increased air movement and improved work of breathing.  This is a short acting medication that can be used every 4-6 hours as needed for increased work of breathing, shortness of  breath, wheezing and excessive coughing.      Robitussin, Mucinex (guaifenesin): This is an expectorant.  This helps break up chest congestion and loosen up thick nasal drainage making phlegm and drainage more liquid and therefore easier to remove.  I recommend being 400 mg three times daily as needed.      Promethazine DM: Promethazine is both a nasal decongestant and an antinausea medication that makes most patients feel fairly sleepy.  The DM is dextromethorphan, a cough suppressant found in many over-the-counter cough medications.  Please take 5 mL before bedtime to minimize your cough which will help you sleep better.     Please follow-up within the next 5-7 days either with your primary care provider or urgent care if your symptoms do not resolve.  If you do not have a primary care provider, we will assist you in finding one.        Thank you for visiting urgent care today.  We appreciate the opportunity to participate in your care.

## 2022-01-29 NOTE — ED Triage Notes (Signed)
Pt. Presents to UC w/ c/o a persistent cough for the past two weeks. Pt. Request RSV testing.

## 2022-02-08 ENCOUNTER — Telehealth: Payer: Self-pay | Admitting: Nurse Practitioner

## 2022-02-08 NOTE — Telephone Encounter (Signed)
Left message to call back  

## 2022-02-08 NOTE — Telephone Encounter (Signed)
Got notice that she may not be taking the labetalol, can we verify please

## 2022-02-11 ENCOUNTER — Telehealth: Payer: Commercial Managed Care - HMO | Admitting: Family

## 2022-02-11 DIAGNOSIS — J209 Acute bronchitis, unspecified: Secondary | ICD-10-CM | POA: Diagnosis not present

## 2022-02-11 MED ORDER — BENZONATATE 100 MG PO CAPS: 100.0000 mg | ORAL_CAPSULE | Freq: Three times a day (TID) | ORAL | 0 refills | Status: DC | PRN

## 2022-02-11 MED ORDER — PREDNISONE 10 MG (21) PO TBPK: ORAL_TABLET | ORAL | 0 refills | Status: DC

## 2022-02-11 NOTE — Progress Notes (Signed)

## 2022-02-12 NOTE — Telephone Encounter (Signed)
Left another message on voicemail to call the office back. 

## 2022-02-15 NOTE — Telephone Encounter (Addendum)
Lvm asking pt to call back.  Need to confirm pt is taking labetalol or not.   Also sending MyChart message.

## 2022-03-18 ENCOUNTER — Telehealth: Payer: Commercial Managed Care - HMO | Admitting: Family

## 2022-03-18 DIAGNOSIS — R399 Unspecified symptoms and signs involving the genitourinary system: Secondary | ICD-10-CM

## 2022-03-18 MED ORDER — CEPHALEXIN 500 MG PO CAPS: 500.0000 mg | ORAL_CAPSULE | Freq: Two times a day (BID) | ORAL | 0 refills | Status: DC

## 2022-03-18 NOTE — Progress Notes (Signed)

## 2022-03-20 ENCOUNTER — Other Ambulatory Visit: Payer: Self-pay | Admitting: Family

## 2022-03-21 ENCOUNTER — Ambulatory Visit (INDEPENDENT_AMBULATORY_CARE_PROVIDER_SITE_OTHER): Payer: Commercial Managed Care - HMO | Admitting: Nurse Practitioner

## 2022-03-21 ENCOUNTER — Encounter: Payer: Self-pay | Admitting: Nurse Practitioner

## 2022-03-21 ENCOUNTER — Other Ambulatory Visit: Payer: Self-pay

## 2022-03-21 VITALS — BP 128/88 | HR 72 | Ht 64.0 in | Wt 299.0 lb

## 2022-03-21 DIAGNOSIS — Z1231 Encounter for screening mammogram for malignant neoplasm of breast: Secondary | ICD-10-CM

## 2022-03-21 DIAGNOSIS — I10 Essential (primary) hypertension: Secondary | ICD-10-CM | POA: Diagnosis not present

## 2022-03-21 DIAGNOSIS — E559 Vitamin D deficiency, unspecified: Secondary | ICD-10-CM

## 2022-03-21 DIAGNOSIS — D509 Iron deficiency anemia, unspecified: Secondary | ICD-10-CM | POA: Diagnosis not present

## 2022-03-21 DIAGNOSIS — Z0000 Encounter for general adult medical examination without abnormal findings: Secondary | ICD-10-CM | POA: Diagnosis not present

## 2022-03-21 DIAGNOSIS — Z6841 Body Mass Index (BMI) 40.0 and over, adult: Secondary | ICD-10-CM

## 2022-03-21 LAB — IBC + FERRITIN
Ferritin: 4.5 ng/mL — ABNORMAL LOW (ref 10.0–291.0)
Iron: 30 ug/dL — ABNORMAL LOW (ref 42–145)
Saturation Ratios: 8 % — ABNORMAL LOW (ref 20.0–50.0)
TIBC: 373.8 ug/dL (ref 250.0–450.0)
Transferrin: 267 mg/dL (ref 212.0–360.0)

## 2022-03-21 LAB — CBC
HCT: 34 % — ABNORMAL LOW (ref 36.0–46.0)
Hemoglobin: 11.1 g/dL — ABNORMAL LOW (ref 12.0–15.0)
MCHC: 32.5 g/dL (ref 30.0–36.0)
MCV: 79.8 fl (ref 78.0–100.0)
Platelets: 460 10*3/uL — ABNORMAL HIGH (ref 150.0–400.0)
RBC: 4.26 Mil/uL (ref 3.87–5.11)
RDW: 16.3 % — ABNORMAL HIGH (ref 11.5–15.5)
WBC: 6.9 10*3/uL (ref 4.0–10.5)

## 2022-03-21 LAB — COMPREHENSIVE METABOLIC PANEL
ALT: 8 U/L (ref 0–35)
AST: 12 U/L (ref 0–37)
Albumin: 3.7 g/dL (ref 3.5–5.2)
Alkaline Phosphatase: 70 U/L (ref 39–117)
BUN: 12 mg/dL (ref 6–23)
CO2: 26 mEq/L (ref 19–32)
Calcium: 8.6 mg/dL (ref 8.4–10.5)
Chloride: 107 mEq/L (ref 96–112)
Creatinine, Ser: 0.78 mg/dL (ref 0.40–1.20)
GFR: 93.69 mL/min (ref >60.00)
Glucose, Bld: 89 mg/dL (ref 70–99)
Potassium: 4.1 mEq/L (ref 3.5–5.1)
Sodium: 140 mEq/L (ref 135–145)
Total Bilirubin: 0.3 mg/dL (ref 0.2–1.2)
Total Protein: 6.4 g/dL (ref 6.0–8.3)

## 2022-03-21 LAB — HEMOGLOBIN A1C: Hgb A1c MFr Bld: 5.9 % (ref 4.6–6.5)

## 2022-03-21 LAB — LIPID PANEL
Cholesterol: 130 mg/dL (ref 0–200)
HDL: 41.2 mg/dL (ref >39.00)
LDL Cholesterol: 71 mg/dL (ref 0–99)
NonHDL: 88.78
Total CHOL/HDL Ratio: 3
Triglycerides: 88 mg/dL (ref 0.0–149.0)
VLDL: 17.6 mg/dL (ref 0.0–40.0)

## 2022-03-21 LAB — TSH: TSH: 5.67 u[IU]/mL — ABNORMAL HIGH (ref 0.35–5.50)

## 2022-03-21 LAB — VITAMIN D 25 HYDROXY (VIT D DEFICIENCY, FRACTURES): VITD: 10.4 ng/mL — ABNORMAL LOW (ref 30.00–100.00)

## 2022-03-21 NOTE — Assessment & Plan Note (Signed)
Patient currently maintained on labetalol 100 mg twice daily.  Patient states she was taking both pills at 1 time.  Advised patient to take as prescribed doing with half-life of medication.  Continue labetalol 100 mg twice daily blood pressure within normal limits

## 2022-03-21 NOTE — Assessment & Plan Note (Signed)
Pending IBC plus ferritin and CBC.

## 2022-03-21 NOTE — Assessment & Plan Note (Signed)
Patient gained 2 pounds since last office visit.  She is stressed with work and works many hours per her report.  Continue working lifestyle modifications inclusive of exercise 30 minutes a day 5 times a week.

## 2022-03-21 NOTE — Assessment & Plan Note (Signed)
Pending vitamin D level today. 

## 2022-03-21 NOTE — Assessment & Plan Note (Signed)
Discussed age-appropriate immunizations and screening exams.  Patient was given information at dismissal about preventative healthcare maintenance with disparate guidance in regards to her age range.  Patient has Pap smear is up-to-date.  Ambulatory referral for mammogram sent today.  Patient is too young for CRC screening.  Work on healthy lifestyle modifications.

## 2022-03-21 NOTE — Patient Instructions (Signed)
Nice to see you today I will be in touch with the labs once I have them Follow up in 1year for you physical, sooner if you need me  Work on exercise. Start slow with 10-45min twice a week and work up to 5 days a week at 30 mins a day

## 2022-03-21 NOTE — Progress Notes (Signed)
Established Patient Office Visit  Subjective   Patient ID: Alisha Beltran, female    DOB: [birth year]  Age: 42 y.o. MRN: [id number]  Chief Complaint  Patient presents with   Annual Exam    HPI  HTN: Patient is currently on Labetalol. States that she has ben doubleing up on the labetalol. States that she did check her BP and it was within normal limits.  Anxiety/depression: Patient currently on any medication.  States it is stress related with her position at work.  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  for complete physical and follow up of chronic conditions.  Immunizations: -Tetanus: Completed in unsure  -Influenza: resfused -Shingles: Too young -Pneumonia: Too young  -HPV: Followed by GYN  Diet:  Eating frequently because of stress with work  Exercise: No regular exercise. States that she is working a lot   Eye exam:  Approx 2 years ago. Needs updating Dental exam:  needs updating   Pap Smear: Completed in  10/09/2021 Dr. Radene Knee Mammogram:  Needs updating ambulatory order placed today  Colonoscopy: Too young, currently average risk Lung Cancer Screening: N/A Dexa: Too young  Sleep States that she is training in another department at work and her sleep schedule varies.  10-6 bedtime and does not feel rested. Does snore and has had a sleep study in the past that was negative for apnea per patient report.      Review of Systems  Constitutional:  Negative for chills and fever.  Respiratory:  Negative for shortness of breath.   Cardiovascular:  Negative for chest pain.  Gastrointestinal:  Negative for abdominal pain, diarrhea, nausea and vomiting.       BM every 3 days   Neurological:  Positive for headaches.  Psychiatric/Behavioral:  Negative for hallucinations and suicidal ideas.       Objective:     BP 128/88   Pulse 72   Ht 5\' 4"  (1.626 m)   Wt 299 lb (135.6 kg)    LMP 03/16/2022 (Exact Date)   SpO2 98%   BMI 51.32 kg/m  BP Readings from Last 3 Encounters:  03/21/22 128/88  01/23/22 (!) 174/79  12/25/21 138/82   Wt Readings from Last 3 Encounters:  03/21/22 299 lb (135.6 kg)  12/25/21 297 lb 6 oz (134.9 kg)  12/18/21 297 lb 8 oz (134.9 kg)      Physical Exam Vitals and nursing note reviewed.  Constitutional:      Appearance: Normal appearance.  HENT:     Right Ear: Tympanic membrane, ear canal and external ear normal.     Left Ear: Tympanic membrane, ear canal and external ear normal.     Mouth/Throat:     Mouth: Mucous membranes are moist.     Pharynx: Oropharynx is clear.  Eyes:     Extraocular Movements: Extraocular movements intact.     Pupils: Pupils are equal, round, and reactive to light.     Comments: Wears glasses   Cardiovascular:     Rate and Rhythm: Normal rate and regular rhythm.     Heart sounds: Normal heart sounds.  Pulmonary:     Effort: Pulmonary effort is normal.     Breath sounds: Normal breath sounds.  Abdominal:     General: Bowel sounds are normal. There is no distension.     Palpations: There is no mass.     Tenderness: There is no abdominal  tenderness.     Hernia: No hernia is present.  Musculoskeletal:     Right lower leg: No edema.     Left lower leg: No edema.  Lymphadenopathy:     Cervical: No cervical adenopathy.  Skin:    General: Skin is warm.  Neurological:     General: No focal deficit present.     Mental Status: She is alert.     Comments: Bilateral upper and lower extremity strength 5/5      No results found for any visits on 03/21/22.    The ASCVD Risk score (Arnett DK, et al., 2019) failed to calculate for the following reasons:   Unable to determine if patient is Non-Hispanic African American    Assessment & Plan:   Problem List Items Addressed This Visit       Cardiovascular and Mediastinum   Essential hypertension    Patient currently maintained on labetalol 100 mg  twice daily.  Patient states she was taking both pills at 1 time.  Advised patient to take as prescribed doing with half-life of medication.  Continue labetalol 100 mg twice daily blood pressure within normal limits      Relevant Orders   CBC   Comprehensive metabolic panel     Other   Morbid obesity with BMI of 50.0-59.9, adult ([place])    Patient gained 2 pounds since last office visit.  She is stressed with work and works many hours per her report.  Continue working lifestyle modifications inclusive of exercise 30 minutes a day 5 times a week.      Vitamin D deficiency    Pending vitamin D level today.      Relevant Orders   VITAMIN D 25 Hydroxy (Vit-D Deficiency, Fractures)   Iron deficiency anemia    Pending IBC plus ferritin and CBC.      Relevant Orders   IBC + Ferritin   Morbid obesity ([place])   Relevant Orders   Hemoglobin A1c   TSH   Lipid panel   Preventative health care - Primary    Discussed age-appropriate immunizations and screening exams.  Patient was given information at dismissal about preventative healthcare maintenance with disparate guidance in regards to her age range.  Patient has Pap smear is up-to-date.  Ambulatory referral for mammogram sent today.  Patient is too young for CRC screening.  Work on healthy lifestyle modifications.      Relevant Orders   CBC   Comprehensive metabolic panel   Hemoglobin A1c   TSH   Lipid panel   Other Visit Diagnoses     Screening mammogram for breast cancer           Return in about 1 year (around 03/22/2023) for CPe and labs.    Romilda Garret, NP

## 2022-03-22 ENCOUNTER — Other Ambulatory Visit: Payer: Self-pay | Admitting: Nurse Practitioner

## 2022-03-22 DIAGNOSIS — D509 Iron deficiency anemia, unspecified: Secondary | ICD-10-CM

## 2022-03-22 DIAGNOSIS — E559 Vitamin D deficiency, unspecified: Secondary | ICD-10-CM

## 2022-03-22 MED ORDER — SLOW IRON 160 (50 FE) MG PO TBCR: 1.0000 | EXTENDED_RELEASE_TABLET | Freq: Every day | ORAL | 0 refills | Status: DC

## 2022-03-22 MED ORDER — VITAMIN D (ERGOCALCIFEROL) 1.25 MG (50000 UNIT) PO CAPS: 50000.0000 [IU] | ORAL_CAPSULE | ORAL | 0 refills | Status: DC

## 2022-06-23 ENCOUNTER — Telehealth: Payer: Commercial Managed Care - HMO | Admitting: Nurse Practitioner

## 2022-06-23 DIAGNOSIS — J069 Acute upper respiratory infection, unspecified: Secondary | ICD-10-CM | POA: Diagnosis not present

## 2022-06-23 MED ORDER — BENZONATATE 100 MG PO CAPS: 100.0000 mg | ORAL_CAPSULE | Freq: Three times a day (TID) | ORAL | 0 refills | Status: DC | PRN

## 2022-06-23 MED ORDER — PREDNISONE 20 MG PO TABS: 40.0000 mg | ORAL_TABLET | Freq: Every day | ORAL | 0 refills | Status: AC

## 2022-06-23 NOTE — Progress Notes (Signed)
E-Visit for Cough  We are sorry that you are not feeling well.  Here is how we plan to help!  Based on your presentation I believe you most likely have A cough due to a virus.  This is called viral bronchitis and is best treated by rest, plenty of fluids and control of the cough.  You may use Ibuprofen or Tylenol as directed to help your symptoms.     In addition you may use A prescription cough medication called Tessalon Perles 100mg. You may take 1-2 capsules every 8 hours as needed for your cough.   From your responses in the eVisit questionnaire you describe inflammation in the upper respiratory tract which is causing a significant cough.  This is commonly called Bronchitis and has four common causes:   Allergies Viral Infections Acid Reflux Bacterial Infection Allergies, viruses and acid reflux are treated by controlling symptoms or eliminating the cause. An example might be a cough caused by taking certain blood pressure medications. You stop the cough by changing the medication. Another example might be a cough caused by acid reflux. Controlling the reflux helps control the cough.  USE OF BRONCHODILATOR ("RESCUE") INHALERS: There is a risk from using your bronchodilator too frequently.  The risk is that over-reliance on a medication which only relaxes the muscles surrounding the breathing tubes can reduce the effectiveness of medications prescribed to reduce swelling and congestion of the tubes themselves.  Although you feel brief relief from the bronchodilator inhaler, your asthma may actually be worsening with the tubes becoming more swollen and filled with mucus.  This can delay other crucial treatments, such as oral steroid medications. If you need to use a bronchodilator inhaler daily, several times per day, you should discuss this with your provider.  There are probably better treatments that could be used to keep your asthma under control.     HOME CARE Only take medications as  instructed by your medical team. Complete the entire course of an antibiotic. Drink plenty of fluids and get plenty of rest. Avoid close contacts especially the very young and the elderly Cover your mouth if you cough or cough into your sleeve. Always remember to wash your hands A steam or ultrasonic humidifier can help congestion.   GET HELP RIGHT AWAY IF: You develop worsening fever. You become short of breath You cough up blood. Your symptoms persist after you have completed your treatment plan MAKE SURE YOU  Understand these instructions. Will watch your condition. Will get help right away if you are not doing well or get worse.    Thank you for choosing an e-visit.  Your e-visit answers were reviewed by a board certified advanced clinical practitioner to complete your personal care plan. Depending upon the condition, your plan could have included both over the counter or prescription medications.  Please review your pharmacy choice. Make sure the pharmacy is open so you can pick up prescription now. If there is a problem, you may contact your provider through MyChart messaging and have the prescription routed to another pharmacy.  Your safety is important to us. If you have drug allergies check your prescription carefully.   For the next 24 hours you can use MyChart to ask questions about today's visit, request a non-urgent call back, or ask for a work or school excuse. You will get an email in the next two days asking about your experience. I hope that your e-visit has been valuable and will speed your recovery.    Alisha Aissata Wilmore, FNP   5-10 minutes spent reviewing and documenting in chart.  

## 2022-06-23 NOTE — Addendum Note (Signed)
Addended by: Bennie Pierini on: 06/23/2022 08:44 AM   Modules accepted: Orders

## 2022-07-06 ENCOUNTER — Telehealth: Payer: Self-pay | Admitting: Physician Assistant

## 2022-07-06 DIAGNOSIS — L304 Erythema intertrigo: Secondary | ICD-10-CM

## 2022-07-06 DIAGNOSIS — R3989 Other symptoms and signs involving the genitourinary system: Secondary | ICD-10-CM

## 2022-07-06 MED ORDER — NYSTATIN 100000 UNIT/GM EX CREA: 1.0000 | TOPICAL_CREAM | Freq: Two times a day (BID) | CUTANEOUS | 0 refills | Status: DC

## 2022-07-06 MED ORDER — NITROFURANTOIN MONOHYD MACRO 100 MG PO CAPS: 100.0000 mg | ORAL_CAPSULE | Freq: Two times a day (BID) | ORAL | 0 refills | Status: DC

## 2022-07-06 NOTE — Progress Notes (Signed)
E Visit for Rash  We are sorry that you are not feeling well. Here is how we plan to help!  Based upon your presentation it appears you have a fungal infection.  I have prescribed: and Nystatin cream apply to the affected area twice daily   HOME CARE:  Take cool showers and avoid direct sunlight. Apply cool compress or wet dressings. Take a bath in an oatmeal bath.  Sprinkle content of one Aveeno packet under running faucet with comfortably warm water.  Bathe for 15-20 minutes, 1-2 times daily.  Pat dry with a towel. Do not rub the rash. Use hydrocortisone cream. Take an antihistamine like Benadryl for widespread rashes that itch.  The adult dose of Benadryl is 25-50 mg by mouth 4 times daily. Caution:  This type of medication may cause sleepiness.  Do not drink alcohol, drive, or operate dangerous machinery while taking antihistamines.  Do not take these medications if you have prostate enlargement.  Read package instructions thoroughly on all medications that you take.  GET HELP RIGHT AWAY IF:  Symptoms don't go away after treatment. Severe itching that persists. If you rash spreads or swells. If you rash begins to smell. If it blisters and opens or develops a yellow-brown crust. You develop a fever. You have a sore throat. You become short of breath.  MAKE SURE YOU:  Understand these instructions. Will watch your condition. Will get help right away if you are not doing well or get worse.  Thank you for choosing an e-visit.  Your e-visit answers were reviewed by a board certified advanced clinical practitioner to complete your personal care plan. Depending upon the condition, your plan could have included both over the counter or prescription medications.  Please review your pharmacy choice. Make sure the pharmacy is open so you can pick up prescription now. If there is a problem, you may contact your provider through MyChart messaging and have the prescription routed to  another pharmacy.  Your safety is important to us. If you have drug allergies check your prescription carefully.   For the next 24 hours you can use MyChart to ask questions about today's visit, request a non-urgent call back, or ask for a work or school excuse. You will get an email in the next two days asking about your experience. I hope that your e-visit has been valuable and will speed your recovery.  I have spent 5 minutes in review of e-visit questionnaire, review and updating patient chart, medical decision making and response to patient.   Asja Frommer M Kaysin Brock, PA-C  

## 2022-07-06 NOTE — Progress Notes (Signed)
E-Visit for Urinary Problems  We are sorry that you are not feeling well.  Here is how we plan to help!  Based on what you shared with me it looks like you most likely have a simple urinary tract infection.  A UTI (Urinary Tract Infection) is a bacterial infection of the bladder.  Most cases of urinary tract infections are simple to treat but a key part of your care is to encourage you to drink plenty of fluids and watch your symptoms carefully.  I have prescribed MacroBid 100 mg twice a day for 5 days.  Your symptoms should gradually improve. Call us if the burning in your urine worsens, you develop worsening fever, back pain or pelvic pain or if your symptoms do not resolve after completing the antibiotic.  Urinary tract infections can be prevented by drinking plenty of water to keep your body hydrated.  Also be sure when you wipe, wipe from front to back and don't hold it in!  If possible, empty your bladder every 4 hours.  HOME CARE Drink plenty of fluids Compete the full course of the antibiotics even if the symptoms resolve Remember, when you need to go.go. Holding in your urine can increase the likelihood of getting a UTI! GET HELP RIGHT AWAY IF: You cannot urinate You get a high fever Worsening back pain occurs You see blood in your urine You feel sick to your stomach or throw up You feel like you are going to pass out  MAKE SURE YOU  Understand these instructions. Will watch your condition. Will get help right away if you are not doing well or get worse.   Thank you for choosing an e-visit.  Your e-visit answers were reviewed by a board certified advanced clinical practitioner to complete your personal care plan. Depending upon the condition, your plan could have included both over the counter or prescription medications.  Please review your pharmacy choice. Make sure the pharmacy is open so you can pick up prescription now. If there is a problem, you may contact your  provider through MyChart messaging and have the prescription routed to another pharmacy.  Your safety is important to us. If you have drug allergies check your prescription carefully.   For the next 24 hours you can use MyChart to ask questions about today's visit, request a non-urgent call back, or ask for a work or school excuse. You will get an email in the next two days asking about your experience. I hope that your e-visit has been valuable and will speed your recovery.  I have spent 5 minutes in review of e-visit questionnaire, review and updating patient chart, medical decision making and response to patient.   Tyleah Loh M Naquita Nappier, PA-C  

## 2022-07-17 ENCOUNTER — Telehealth: Payer: Self-pay | Admitting: Nurse Practitioner

## 2022-07-17 NOTE — Telephone Encounter (Signed)
LVM for patient to c/b and schedule.  

## 2022-07-17 NOTE — Telephone Encounter (Signed)
Patient never returned to check her iron and vitamin D levels. Can we reach out and get patient scheduled

## 2022-07-19 ENCOUNTER — Encounter: Payer: Self-pay | Admitting: Nurse Practitioner

## 2022-07-20 NOTE — Telephone Encounter (Signed)
If this is a new problem she needs to be seen in office for evaluation and I can discuss the FMLA paperwork with her

## 2022-07-20 NOTE — Telephone Encounter (Signed)
Left message to return call to our office.  

## 2022-08-03 ENCOUNTER — Ambulatory Visit (INDEPENDENT_AMBULATORY_CARE_PROVIDER_SITE_OTHER): Payer: Self-pay | Admitting: Nurse Practitioner

## 2022-08-03 ENCOUNTER — Encounter: Payer: Self-pay | Admitting: Nurse Practitioner

## 2022-08-03 VITALS — BP 124/76 | HR 80 | Temp 98.2°F | Resp 16 | Ht 64.0 in | Wt 301.0 lb

## 2022-08-03 DIAGNOSIS — I10 Essential (primary) hypertension: Secondary | ICD-10-CM

## 2022-08-03 DIAGNOSIS — E559 Vitamin D deficiency, unspecified: Secondary | ICD-10-CM

## 2022-08-03 DIAGNOSIS — R7989 Other specified abnormal findings of blood chemistry: Secondary | ICD-10-CM

## 2022-08-03 DIAGNOSIS — G43109 Migraine with aura, not intractable, without status migrainosus: Secondary | ICD-10-CM

## 2022-08-03 DIAGNOSIS — D509 Iron deficiency anemia, unspecified: Secondary | ICD-10-CM

## 2022-08-03 LAB — IBC + FERRITIN
Ferritin: 5.1 ng/mL — ABNORMAL LOW (ref 10.0–291.0)
Iron: 23 ug/dL — ABNORMAL LOW (ref 42–145)
Saturation Ratios: 6.3 % — ABNORMAL LOW (ref 20.0–50.0)
TIBC: 365.4 ug/dL (ref 250.0–450.0)
Transferrin: 261 mg/dL (ref 212.0–360.0)

## 2022-08-03 LAB — VITAMIN D 25 HYDROXY (VIT D DEFICIENCY, FRACTURES): VITD: 14.16 ng/mL — ABNORMAL LOW (ref 30.00–100.00)

## 2022-08-03 LAB — CBC
HCT: 35.5 % — ABNORMAL LOW (ref 36.0–46.0)
Hemoglobin: 11.4 g/dL — ABNORMAL LOW (ref 12.0–15.0)
MCHC: 32.1 g/dL (ref 30.0–36.0)
MCV: 79.6 fl (ref 78.0–100.0)
Platelets: 460 10*3/uL — ABNORMAL HIGH (ref 150.0–400.0)
RBC: 4.46 Mil/uL (ref 3.87–5.11)
RDW: 17 % — ABNORMAL HIGH (ref 11.5–15.5)
WBC: 8.2 10*3/uL (ref 4.0–10.5)

## 2022-08-03 LAB — TSH: TSH: 2.84 u[IU]/mL (ref 0.35–5.50)

## 2022-08-03 LAB — T3, FREE: T3, Free: 2.8 pg/mL (ref 2.3–4.2)

## 2022-08-03 LAB — T4, FREE: Free T4: 0.68 ng/dL (ref 0.60–1.60)

## 2022-08-03 MED ORDER — LABETALOL HCL 100 MG PO TABS: 100.0000 mg | ORAL_TABLET | Freq: Two times a day (BID) | ORAL | 1 refills | Status: DC

## 2022-08-03 MED ORDER — SUMATRIPTAN SUCCINATE 25 MG PO TABS: ORAL_TABLET | ORAL | 0 refills | Status: DC

## 2022-08-03 NOTE — Patient Instructions (Signed)
Nice to see you today Follow up with me in 6 weeks sooner if you need me The headache medication can make you sleepy so use caution

## 2022-08-03 NOTE — Assessment & Plan Note (Signed)
Previous TSH was abnormal pending TSH with free T3 and free T4.

## 2022-08-03 NOTE — Assessment & Plan Note (Signed)
History of the same with 90 days worth of oral iron replacement is pending iron levels today

## 2022-08-03 NOTE — Progress Notes (Signed)
Acute Office Visit  Subjective:     Patient ID: Alisha Beltran, female    DOB: 04/13/1980, 42 y.o.   MRN: [id number]  Chief Complaint  Patient presents with   Migraine     Patient is in today for migraine headaches with a history of htn  States that she the headaches started approx 4 months ago. Descibred as a blinging headahce. Being the right eye. States it hurts to the point that she cannot focus. States that she can tell when it is coming with halos around light. Nausesa and light senstiivy. States that she is having it approx 1 a month. Last a day when it happens. States that she has been using advil migrinae that does help. Does have to lay down. Pulsatting pain when it is there. States that it is be behind the right and it can move to the crown of the head. Patient was interested in getting FMLA paper filled out for 1-2 times per week 1 day at a time in regards to her migraine headaches.   Patient does have a history of iron deficiency she took oral iron replacement for 90 days.  Patient also has a history of vitamin D deficiency and was replaced with 3 months worth of prescription vitamin D.  She is here for lab  Review of Systems  Constitutional:  Negative for chills and fever.  Respiratory:  Negative for shortness of breath.   Cardiovascular:  Negative for chest pain.  Neurological:  Negative for headaches.  Psychiatric/Behavioral:  Negative for hallucinations and suicidal ideas.         Objective:    BP 124/76   Pulse 80   Temp 98.2 F (36.8 C)   Resp 16   Ht 5\' 4"  (1.626 m)   Wt (!) 301 lb (136.5 kg)   LMP 07/20/2022 (Approximate)   SpO2 98%   BMI 51.67 kg/m  BP Readings from Last 3 Encounters:  08/03/22 124/76  03/21/22 128/88  01/23/22 (!) 174/79   Wt Readings from Last 3 Encounters:  08/03/22 (!) 301 lb (136.5 kg)  03/21/22 299 lb (135.6 kg)  12/25/21 297 lb 6 oz (134.9 kg)      Physical Exam Vitals and nursing note reviewed.  Constitutional:       Appearance: Normal appearance.  HENT:     Mouth/Throat:     Mouth: Mucous membranes are moist.     Pharynx: Oropharynx is clear.  Eyes:     Pupils: Pupils are equal, round, and reactive to light.  Cardiovascular:     Rate and Rhythm: Normal rate and regular rhythm.     Heart sounds: Normal heart sounds.  Pulmonary:     Effort: Pulmonary effort is normal.     Breath sounds: Normal breath sounds.  Musculoskeletal:     Cervical back: No tenderness or bony tenderness. No pain with movement.  Neurological:     General: No focal deficit present.     Mental Status: She is alert.     Deep Tendon Reflexes:     Reflex Scores:      Bicep reflexes are 1+ on the right side and 1+ on the left side.      Patellar reflexes are 1+ on the right side and 1+ on the left side.    Comments: Bilateral upper and lower extremity strength 5/5     No results found for any visits on 08/03/22.      Assessment & Plan:   Problem List  Items Addressed This Visit       Cardiovascular and Mediastinum   Essential hypertension    Patient medication refill refill provided today.      Relevant Medications   labetalol (NORMODYNE) 100 MG tablet   Other Relevant Orders   CBC   Migraine with aura and without status migrainosus, not intractable - Primary    Treat patient with Imitrex 25 mg at onset of headache may repeat in 2 hours if needed.  No red flags in office does not need imaging at this juncture.  Patient's neurological exam totally benign      Relevant Medications   labetalol (NORMODYNE) 100 MG tablet   SUMAtriptan (IMITREX) 25 MG tablet     Other   Vitamin D deficiency    History of the same patient was replaced but not doing any over-the-counter vitamin D supplementation.  Pending vitamin D level        Relevant Orders   VITAMIN D 25 Hydroxy (Vit-D Deficiency, Fractures)   Iron deficiency anemia    History of the same with 90 days worth of oral iron replacement is pending iron  levels today      Relevant Orders   IBC + Ferritin   CBC   Abnormal TSH    Previous TSH was abnormal pending TSH with free T3 and free T4.      Relevant Orders   T3, free   T4, free   TSH    Meds ordered this encounter  Medications   labetalol (NORMODYNE) 100 MG tablet    Sig: Take 1 tablet (100 mg total) by mouth 2 (two) times daily.    Dispense:  180 tablet    Refill:  1    Order Specific Question:   Supervising Provider    Answer:   Roxy Manns A [[id number]]   SUMAtriptan (IMITREX) 25 MG tablet    Sig: Take 1 tablet (25MG ) at onset of headache. May repeat 1 tablet (25MG ) in two hours if headache persists.    Dispense:  10 tablet    Refill:  0    Order Specific Question:   Supervising Provider    Answer:   Roxy Manns A [[id number]]    Return in about 6 weeks (around 09/14/2022) for migraine headache.  Audria Nine, NP

## 2022-08-03 NOTE — Assessment & Plan Note (Signed)
Patient medication refill refill provided today.

## 2022-08-03 NOTE — Assessment & Plan Note (Signed)
Treat patient with Imitrex 25 mg at onset of headache may repeat in 2 hours if needed.  No red flags in office does not need imaging at this juncture.  Patient's neurological exam totally benign

## 2022-08-03 NOTE — Assessment & Plan Note (Signed)
History of the same patient was replaced but not doing any over-the-counter vitamin D supplementation.  Pending vitamin D level

## 2022-08-08 ENCOUNTER — Encounter: Payer: Self-pay | Admitting: Nurse Practitioner

## 2022-08-08 ENCOUNTER — Other Ambulatory Visit: Payer: Self-pay | Admitting: Nurse Practitioner

## 2022-08-08 DIAGNOSIS — D509 Iron deficiency anemia, unspecified: Secondary | ICD-10-CM

## 2022-08-08 DIAGNOSIS — E559 Vitamin D deficiency, unspecified: Secondary | ICD-10-CM

## 2022-08-08 MED ORDER — VITAMIN D (ERGOCALCIFEROL) 1.25 MG (50000 UNIT) PO CAPS: 50000.0000 [IU] | ORAL_CAPSULE | ORAL | 0 refills | Status: DC

## 2022-08-09 MED ORDER — SLOW IRON 160 (50 FE) MG PO TBCR: 1.0000 | EXTENDED_RELEASE_TABLET | Freq: Every day | ORAL | 0 refills | Status: DC

## 2022-08-09 NOTE — Telephone Encounter (Signed)
Need 2 month lab visit for Iron recheck

## 2022-08-09 NOTE — Telephone Encounter (Signed)
Left message to return call to our office.  Pt will need a 2 month lab only appt.

## 2022-08-12 NOTE — Telephone Encounter (Signed)
Forms have been completed and placed in [facility] outgoing box. Please look over the form for completeness and fill in appropriate office information

## 2022-08-13 NOTE — Telephone Encounter (Signed)
Formed looked over office information, made copies, fax, and notified pt.

## 2022-08-13 NOTE — Telephone Encounter (Signed)
Form can't be fax will inform pt that form is in folder up front ready to be picked up.

## 2022-08-27 NOTE — Telephone Encounter (Signed)
Copy made and placed up front for pt to pick up. Called pt and sent a mychart message to inform her that paperwork is ready.

## 2022-08-27 NOTE — Telephone Encounter (Signed)
Form completed and put in the out going box for the MA. Please copy and scan it

## 2022-09-24 ENCOUNTER — Ambulatory Visit: Payer: Self-pay | Admitting: Nurse Practitioner

## 2022-09-25 ENCOUNTER — Encounter: Payer: Self-pay | Admitting: Nurse Practitioner

## 2022-09-26 IMAGING — DX DG ANKLE COMPLETE 3+V*L*
3 series · 3 of 3 positions shown · non-contrast
Comparison: X-ray 10/23/2013.

CLINICAL DATA: Left ankle pain and swelling. Patient reports pain
is medial side of foot and yesterday started going to the knee.

EXAM:
LEFT ANKLE COMPLETE - 3+ VIEW

[ankle ap]
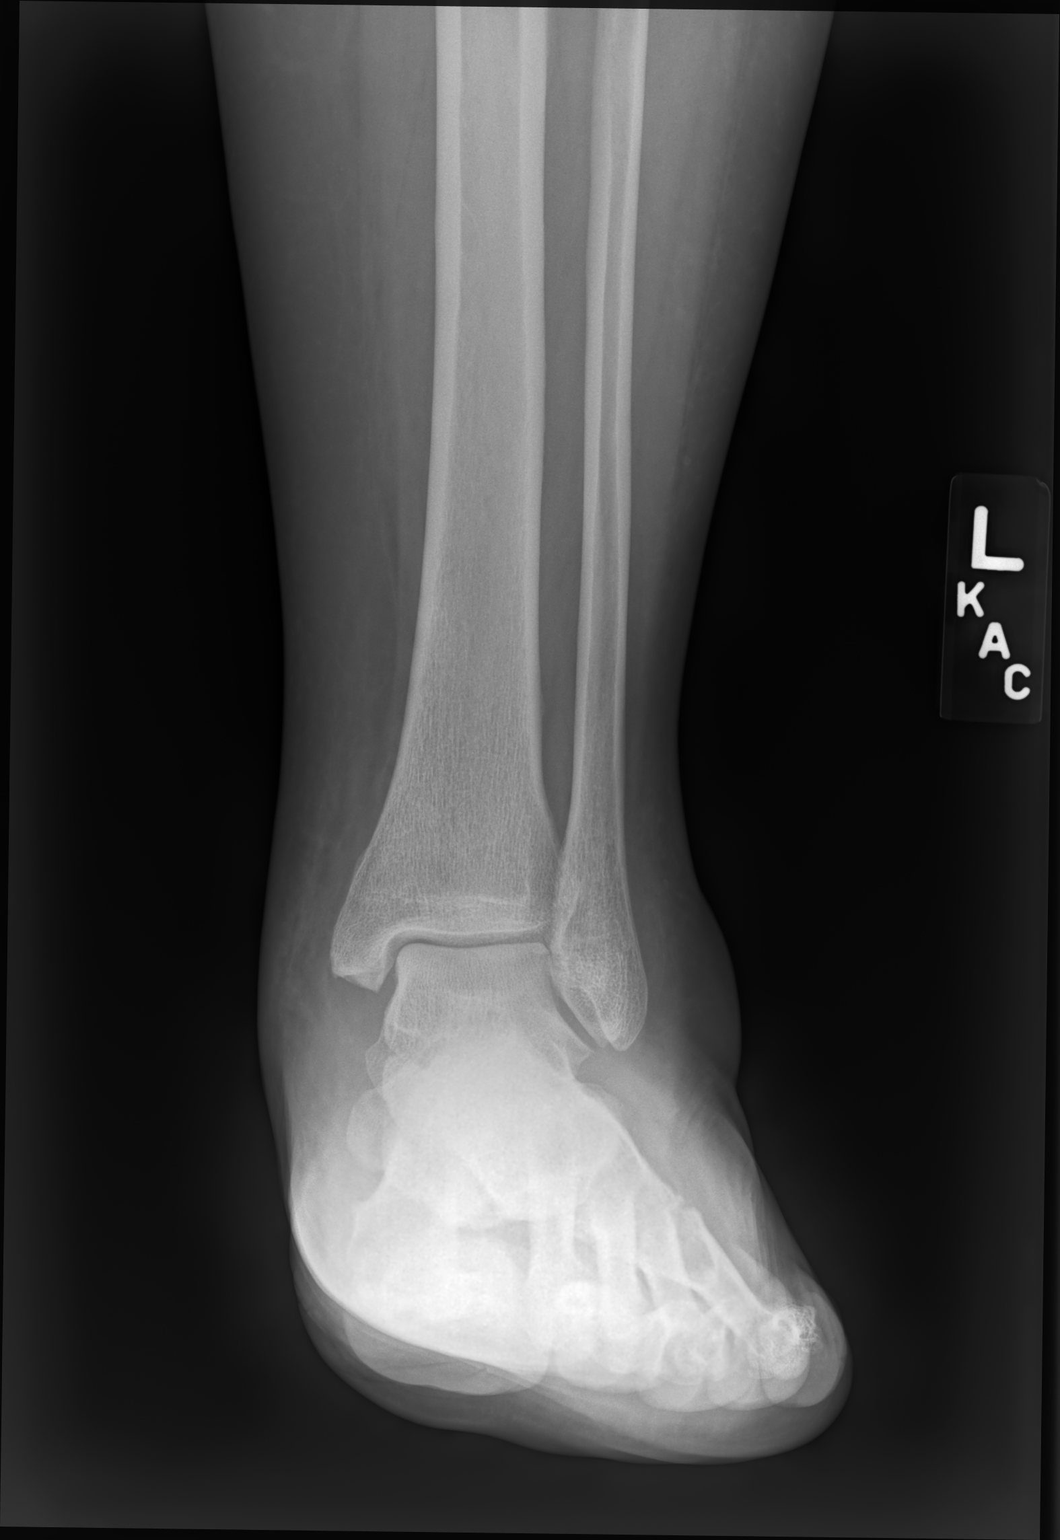

[ankle mlo]
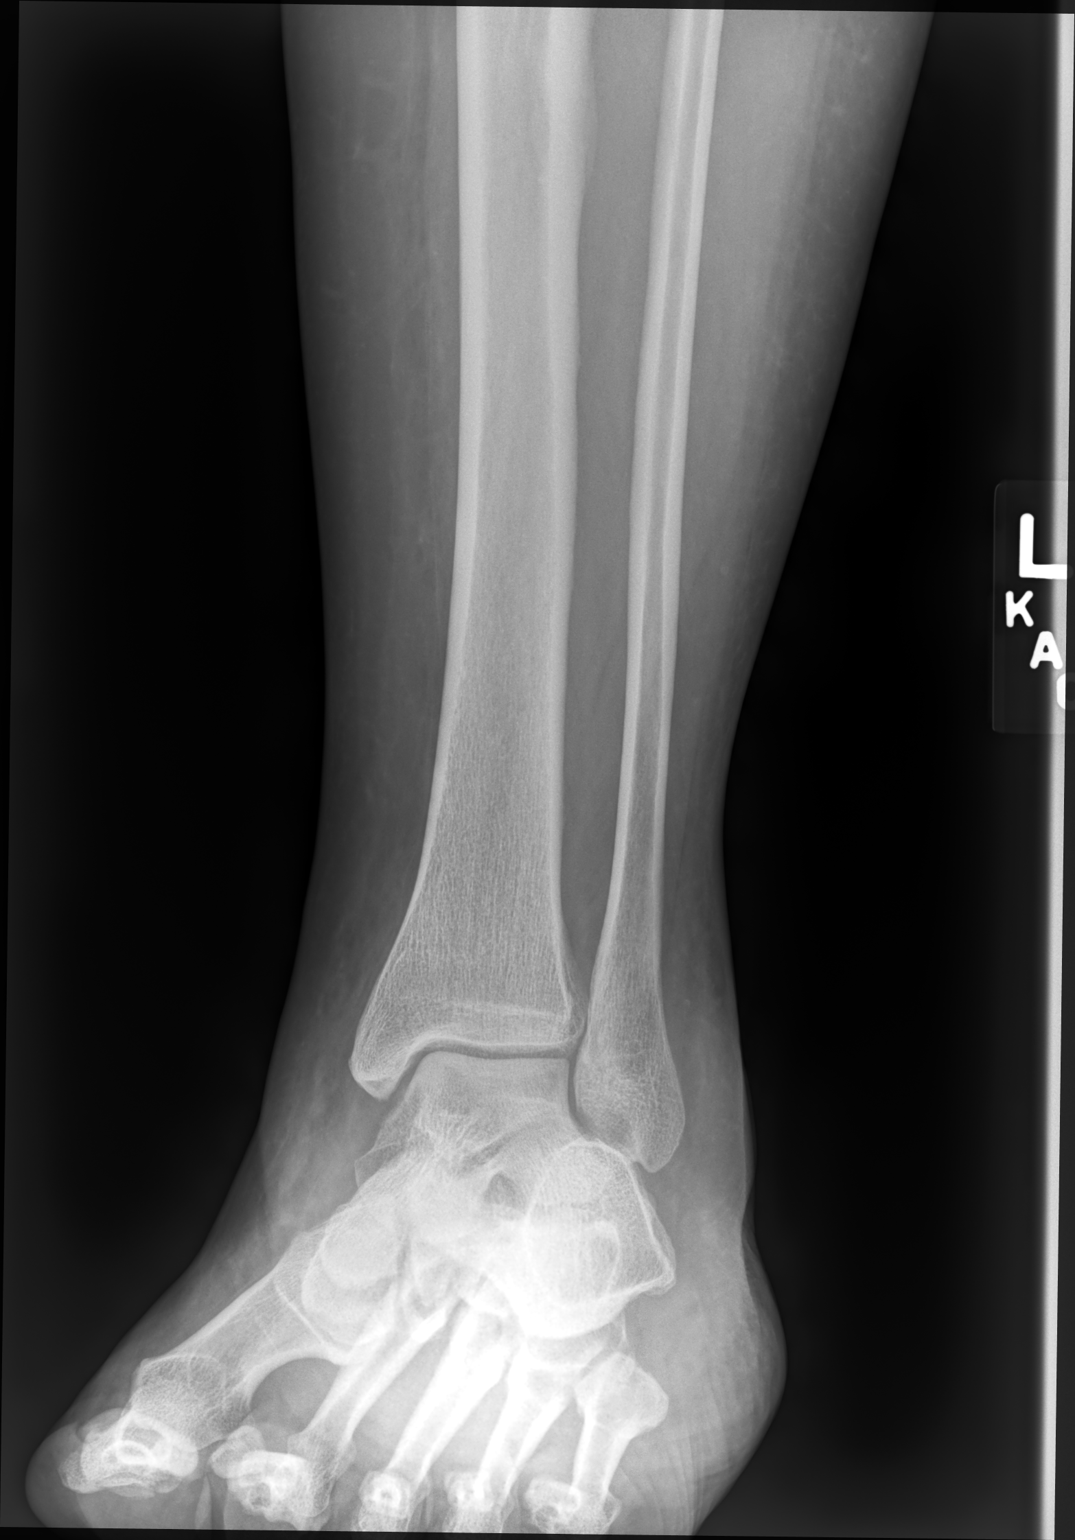

[ankle lat]
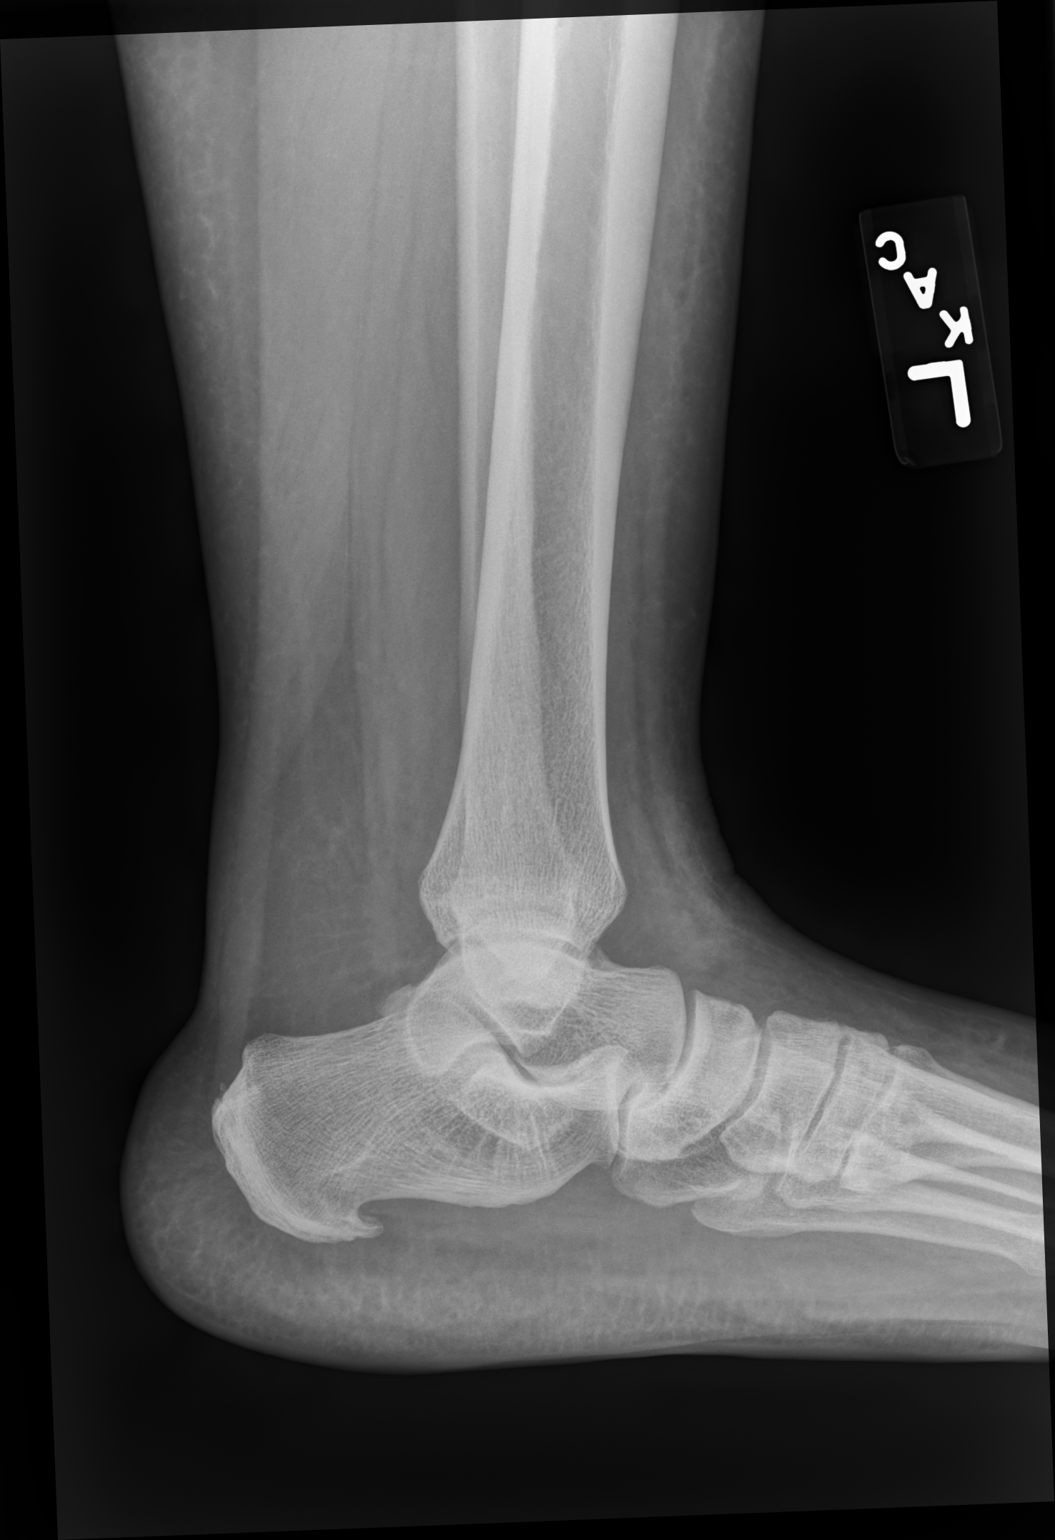

[3 of 3 positions shown; findings below may reference images not displayed]

FINDINGS: There is no evidence of fracture, dislocation, or joint effusion.
There is no evidence of arthropathy. A moderate-sized plantar
calcaneal spur is noted. Moderate to marked severity diffuse soft
tissue swelling is seen.
IMPRESSION: Diffuse soft tissue swelling without an acute osseous abnormality.

## 2022-11-10 ENCOUNTER — Telehealth: Payer: Self-pay | Admitting: Nurse Practitioner

## 2022-11-10 DIAGNOSIS — N944 Primary dysmenorrhea: Secondary | ICD-10-CM

## 2022-11-10 MED ORDER — IBUPROFEN 600 MG PO TABS: 600.0000 mg | ORAL_TABLET | Freq: Three times a day (TID) | ORAL | 0 refills | Status: DC | PRN

## 2022-11-10 NOTE — Progress Notes (Signed)
E-Visit for Vaginal Symptoms  We are sorry that you are not feeling well. Here is how we plan to help! Based on what you shared with me I have refilled your motrin.   Be sure to take all of the medication as directed. Stop taking any medication if you develop a rash, tongue swelling or shortness of breath. Mothers who are breast feeding should consider pumping and discarding their breast milk while on these antibiotics. However, there is no consensus that infant exposure at these doses would be harmful.  Remember that medication creams can weaken latex condoms. .    GET HELP RIGHT AWAY IF:  You have pain in your lower abdomen ( pelvic area or over your ovaries) You develop nausea or vomiting You develop a fever Your discharge changes or worsens You have persistent pain with intercourse You develop shortness of breath, a rapid pulse, or you faint.  These symptoms could be signs of problems or infections that need to be evaluated by a medical provider now.  MAKE SURE YOU   Understand these instructions. Will watch your condition. Will get help right away if you are not doing well or get worse.  Thank you for choosing an e-visit.  Your e-visit answers were reviewed by a board certified advanced clinical practitioner to complete your personal care plan. Depending upon the condition, your plan could have included both over the counter or prescription medications.  Please review your pharmacy choice. Make sure the pharmacy is open so you can pick up prescription now. If there is a problem, you may contact your provider through [employer] and have the prescription routed to another pharmacy.  Your safety is important to [place]. If you have drug allergies check your prescription carefully.   For the next 24 hours you can use MyChart to ask questions about today's visit, request a non-urgent call back, or ask for a work or school excuse. You will get an email in the next two days asking  about your experience. I hope that your e-visit has been valuable and will speed your recovery.

## 2022-11-10 NOTE — Progress Notes (Signed)
I have spent 5 minutes in review of e-visit questionnaire, review and updating patient chart, medical decision making and response to patient.  ° °Zelda W Fleming, NP ° °  °

## 2023-01-09 ENCOUNTER — Telehealth: Payer: Self-pay | Admitting: Physician Assistant

## 2023-01-09 DIAGNOSIS — N944 Primary dysmenorrhea: Secondary | ICD-10-CM

## 2023-01-11 NOTE — Progress Notes (Signed)
   Thank you for the details you included in the comment boxes. Those details are very helpful in determining the best course of treatment for you and help [place] to provide the best care. Because of having painful mestruation, we recommend that you schedule a Virtual Urgent Care video visit in order for the provider to better assess what is going on.  The provider will be able to give you a more accurate diagnosis and treatment plan if we can more freely discuss your symptoms and with the addition of a virtual examination.   If you change your visit to a video visit, we will bill your insurance (similar to an office visit) and you will not be charged for this e-Visit. You will be able to stay at home and speak with the first available [facility] advanced practice provider. The link to do a video visit is in the drop down Menu tab of your Welcome screen in MyChart.    I have spent 5 minutes in review of e-visit questionnaire, review and updating patient chart, medical decision making and response to patient.   Margaretann Loveless, PA-C

## 2023-04-17 ENCOUNTER — Telehealth: Payer: Self-pay | Admitting: Physician Assistant

## 2023-04-17 ENCOUNTER — Telehealth: Payer: No Typology Code available for payment source | Admitting: Physician Assistant

## 2023-04-17 DIAGNOSIS — H6990 Unspecified Eustachian tube disorder, unspecified ear: Secondary | ICD-10-CM

## 2023-04-17 DIAGNOSIS — J069 Acute upper respiratory infection, unspecified: Secondary | ICD-10-CM | POA: Diagnosis not present

## 2023-04-17 MED ORDER — IPRATROPIUM BROMIDE 0.03 % NA SOLN: 2.0000 | Freq: Two times a day (BID) | NASAL | 0 refills | Status: DC

## 2023-04-17 MED ORDER — PREDNISONE 10 MG (21) PO TBPK: ORAL_TABLET | ORAL | 0 refills | Status: DC

## 2023-04-17 NOTE — Progress Notes (Signed)

## 2023-04-17 NOTE — Progress Notes (Signed)

## 2023-04-17 NOTE — Progress Notes (Signed)
 I have spent 5 minutes in review of e-visit questionnaire, review and updating patient chart, medical decision making and response to patient.   Piedad Climes, PA-C

## 2023-04-30 ENCOUNTER — Ambulatory Visit: Payer: No Typology Code available for payment source | Admitting: Nurse Practitioner

## 2023-05-29 ENCOUNTER — Ambulatory Visit: Payer: No Typology Code available for payment source | Admitting: Nurse Practitioner

## 2023-06-12 ENCOUNTER — Ambulatory Visit: Admitting: Nurse Practitioner

## 2023-06-12 VITALS — BP 128/80 | HR 84 | Temp 98.4°F | Ht 64.0 in | Wt 308.8 lb

## 2023-06-12 DIAGNOSIS — R0681 Apnea, not elsewhere classified: Secondary | ICD-10-CM | POA: Insufficient documentation

## 2023-06-12 DIAGNOSIS — E559 Vitamin D deficiency, unspecified: Secondary | ICD-10-CM | POA: Diagnosis not present

## 2023-06-12 DIAGNOSIS — E538 Deficiency of other specified B group vitamins: Secondary | ICD-10-CM

## 2023-06-12 DIAGNOSIS — D509 Iron deficiency anemia, unspecified: Secondary | ICD-10-CM | POA: Diagnosis not present

## 2023-06-12 DIAGNOSIS — G43109 Migraine with aura, not intractable, without status migrainosus: Secondary | ICD-10-CM | POA: Diagnosis not present

## 2023-06-12 DIAGNOSIS — H9312 Tinnitus, left ear: Secondary | ICD-10-CM | POA: Insufficient documentation

## 2023-06-12 DIAGNOSIS — R7989 Other specified abnormal findings of blood chemistry: Secondary | ICD-10-CM | POA: Diagnosis not present

## 2023-06-12 DIAGNOSIS — R5383 Other fatigue: Secondary | ICD-10-CM | POA: Insufficient documentation

## 2023-06-12 DIAGNOSIS — I10 Essential (primary) hypertension: Secondary | ICD-10-CM | POA: Diagnosis not present

## 2023-06-12 DIAGNOSIS — Z1231 Encounter for screening mammogram for malignant neoplasm of breast: Secondary | ICD-10-CM

## 2023-06-12 MED ORDER — LABETALOL HCL 100 MG PO TABS: 100.0000 mg | ORAL_TABLET | Freq: Two times a day (BID) | ORAL | 1 refills | Status: DC

## 2023-06-12 MED ORDER — SUMATRIPTAN SUCCINATE 25 MG PO TABS: ORAL_TABLET | ORAL | 1 refills | Status: DC

## 2023-06-12 NOTE — Assessment & Plan Note (Signed)
 History of the same pending repeat CBC and iron studies

## 2023-06-12 NOTE — Assessment & Plan Note (Signed)
 History of same pending CBC IBC plus ferritin

## 2023-06-12 NOTE — Assessment & Plan Note (Signed)
 History of same pending B12 level today

## 2023-06-12 NOTE — Patient Instructions (Signed)
Nice to see you today I will be in touch with the labs once I have them Follow up with me in 6 months for your physical and labs

## 2023-06-12 NOTE — Assessment & Plan Note (Signed)
 History of same pending vitamin D level today patient is experiencing fatigue

## 2023-06-12 NOTE — Assessment & Plan Note (Signed)
 Elevated Epworth Sleepiness Scale.  Witnessed episode of apnea ordered at home sleep study.  Discouraged patient using husband's old CPAP

## 2023-06-12 NOTE — Progress Notes (Addendum)
 Acute Office Visit  Subjective:     Patient ID: Alisha Beltran, female    DOB: 1980-04-15, 43 y.o.   MRN: [id number]  Chief Complaint  Patient presents with   sleep study     Patient complains of need for sleep study. Pt states that husband noticed that she stops breathing at night often.    Ear Problem    Pt complains of constant ear bud use at work that resulted in itchy ears, pt states that after scratching them for so long she starts to hear a "winding/wheezing" sound in ear.    Medication Refill    Sumatriptan    labs work    Pt is concerned about her thyroid, iron and vitamin D levels.      Patient is in today for multiple complaints with a history of migraine, htn, allergic rhinitis, vitamin D def, anxiety and depression.  Sleep study: states that he spouse has noticed that she stops breathing. She is also waking up and feeling that she is having non restorative sleep with daytime sleepiness. States that she is using her husband old CPAP. States that she will get morning headache that is in the right front part of her head. That will last most of the day .  Ear problem: states that she started using ear phones at work and her ears started itching. Both but mainly the left. States that they both itch. States that she was digging in her ear, with a Q-tip. States that she would dig and now hears winding rhytmtic sound. States that she is not having any blood but an increase in wax. She has not tried otc antihistamine or nasal sprays  Review of Systems  Constitutional:  Positive for malaise/fatigue. Negative for chills and fever.  Respiratory:  Negative for shortness of breath.   Cardiovascular:  Negative for chest pain.  Neurological:  Positive for headaches.        Objective:    BP 128/80   Pulse 84   Temp 98.4 F (36.9 C) (Oral)   Ht 5\' 4"  (1.626 m)   Wt (!) 308 lb 12.8 oz (140.1 kg)   SpO2 97%   BMI 53.01 kg/m    Physical Exam Vitals and nursing note  reviewed.  Constitutional:      Appearance: Normal appearance.  HENT:     Right Ear: Tympanic membrane, ear canal and external ear normal.     Left Ear: Tympanic membrane, ear canal and external ear normal.     Mouth/Throat:     Mouth: Mucous membranes are moist.     Pharynx: Oropharynx is clear.  Cardiovascular:     Rate and Rhythm: Normal rate and regular rhythm.     Heart sounds: Normal heart sounds.  Pulmonary:     Effort: Pulmonary effort is normal.     Breath sounds: Normal breath sounds.  Lymphadenopathy:     Cervical: No cervical adenopathy.  Neurological:     Mental Status: She is alert.     No results found for any visits on 06/12/23.      Assessment & Plan:   Problem List Items Addressed This Visit       Cardiovascular and Mediastinum   Essential hypertension   Refill labetalol today.  Patient's blood pressure well-controlled      Relevant Medications   labetalol (NORMODYNE) 100 MG tablet   Other Relevant Orders   CBC   Comprehensive metabolic panel with GFR   Hemoglobin A1c  TSH   Migraine with aura and without status migrainosus, not intractable   History of same.  Patient would like refill on sumatriptan refill provided      Relevant Medications   labetalol (NORMODYNE) 100 MG tablet   SUMAtriptan (IMITREX) 25 MG tablet     Other   Abnormal CBC   History of the same pending repeat CBC and iron studies      Relevant Orders   CBC   Vitamin D deficiency   History of same pending vitamin D level today patient is experiencing fatigue      Relevant Orders   VITAMIN D 25 Hydroxy (Vit-D Deficiency, Fractures)   Low serum vitamin B12   History of same pending B12 level today      Relevant Orders   Vitamin B12   Iron deficiency anemia   History of same pending CBC IBC plus ferritin      Relevant Orders   CBC   IBC + Ferritin   Morbid obesity (HCC)   Pending labs.  Work on healthy lifestyle modifications      Relevant Orders    Hemoglobin A1c   Other fatigue   Will check CBC, CMP, TSH, iron, B12, vitamin D also sent off referral for at home sleep study      Relevant Orders   CBC   Comprehensive metabolic panel with GFR   TSH   T3, free   T4, free   Witnessed episode of apnea - Primary   Elevated Epworth Sleepiness Scale.  Witnessed episode of apnea ordered at home sleep study.  Discouraged patient using husband's old CPAP      Relevant Orders   Ambulatory referral to Sleep Studies   Tinnitus of left ear   Will start with otc antihistamine Zyrtec and flonase nasal spray. If no improvement consider ENT referral       Other Visit Diagnoses       Screening mammogram for breast cancer       Relevant Orders   MM 3D SCREENING MAMMOGRAM BILATERAL BREAST       Meds ordered this encounter  Medications   labetalol (NORMODYNE) 100 MG tablet    Sig: Take 1 tablet (100 mg total) by mouth 2 (two) times daily.    Dispense:  180 tablet    Refill:  1    Supervising Provider:   Roxy Manns A [[id number]]   SUMAtriptan (IMITREX) 25 MG tablet    Sig: Take 1 tablet (25MG ) at onset of headache. May repeat 1 tablet (25MG ) in two hours if headache persists.    Dispense:  10 tablet    Refill:  1    Supervising Provider:   Roxy Manns A [[id number]]    Return in about 6 months (around 12/12/2023) for CPE and Labs.  Audria Nine, NP

## 2023-06-12 NOTE — Assessment & Plan Note (Signed)
 Refill labetalol today.  Patient's blood pressure well-controlled

## 2023-06-12 NOTE — Assessment & Plan Note (Signed)
 History of same.  Patient would like refill on sumatriptan refill provided

## 2023-06-12 NOTE — Assessment & Plan Note (Signed)
 Will start with otc antihistamine Zyrtec and flonase nasal spray. If no improvement consider ENT referral

## 2023-06-12 NOTE — Assessment & Plan Note (Signed)
 Will check CBC, CMP, TSH, iron, B12, vitamin D also sent off referral for at home sleep study

## 2023-06-12 NOTE — Assessment & Plan Note (Signed)
 Pending labs.  Work on healthy lifestyle modifications

## 2023-06-13 ENCOUNTER — Other Ambulatory Visit: Payer: Self-pay | Admitting: Nurse Practitioner

## 2023-06-13 ENCOUNTER — Encounter: Payer: Self-pay | Admitting: Nurse Practitioner

## 2023-06-13 DIAGNOSIS — D509 Iron deficiency anemia, unspecified: Secondary | ICD-10-CM

## 2023-06-13 DIAGNOSIS — E559 Vitamin D deficiency, unspecified: Secondary | ICD-10-CM

## 2023-06-13 LAB — T4, FREE: Free T4: 0.67 ng/dL (ref 0.60–1.60)

## 2023-06-13 LAB — COMPREHENSIVE METABOLIC PANEL WITH GFR
ALT: 8 U/L (ref 0–35)
AST: 13 U/L (ref 0–37)
Albumin: 4 g/dL (ref 3.5–5.2)
Alkaline Phosphatase: 76 U/L (ref 39–117)
BUN: 8 mg/dL (ref 6–23)
CO2: 25 meq/L (ref 19–32)
Calcium: 9 mg/dL (ref 8.4–10.5)
Chloride: 105 meq/L (ref 96–112)
Creatinine, Ser: 0.96 mg/dL (ref 0.40–1.20)
GFR: 72.4 mL/min (ref >60.00)
Glucose, Bld: 83 mg/dL (ref 70–99)
Potassium: 4.4 meq/L (ref 3.5–5.1)
Sodium: 138 meq/L (ref 135–145)
Total Bilirubin: 0.3 mg/dL (ref 0.2–1.2)
Total Protein: 7 g/dL (ref 6.0–8.3)

## 2023-06-13 LAB — IBC + FERRITIN
Ferritin: 5.1 ng/mL — ABNORMAL LOW (ref 10.0–291.0)
Iron: 29 ug/dL — ABNORMAL LOW (ref 42–145)
Saturation Ratios: 6.7 % — ABNORMAL LOW (ref 20.0–50.0)
TIBC: 432.6 ug/dL (ref 250.0–450.0)
Transferrin: 309 mg/dL (ref 212.0–360.0)

## 2023-06-13 LAB — CBC
HCT: 32.2 % — ABNORMAL LOW (ref 36.0–46.0)
Hemoglobin: 10.4 g/dL — ABNORMAL LOW (ref 12.0–15.0)
MCHC: 32.4 g/dL (ref 30.0–36.0)
MCV: 74.5 fl — ABNORMAL LOW (ref 78.0–100.0)
Platelets: 464 10*3/uL — ABNORMAL HIGH (ref 150.0–400.0)
RBC: 4.32 Mil/uL (ref 3.87–5.11)
RDW: 17.2 % — ABNORMAL HIGH (ref 11.5–15.5)
WBC: 8.3 10*3/uL (ref 4.0–10.5)

## 2023-06-13 LAB — TSH: TSH: 3 u[IU]/mL (ref 0.35–5.50)

## 2023-06-13 LAB — VITAMIN B12: Vitamin B-12: 231 pg/mL (ref 211–911)

## 2023-06-13 LAB — VITAMIN D 25 HYDROXY (VIT D DEFICIENCY, FRACTURES): VITD: 11.73 ng/mL — ABNORMAL LOW (ref 30.00–100.00)

## 2023-06-13 LAB — T3, FREE: T3, Free: 3.7 pg/mL (ref 2.3–4.2)

## 2023-06-13 LAB — HEMOGLOBIN A1C: Hgb A1c MFr Bld: 5.9 % (ref 4.6–6.5)

## 2023-06-13 MED ORDER — VITAMIN D (ERGOCALCIFEROL) 1.25 MG (50000 UNIT) PO CAPS: 50000.0000 [IU] | ORAL_CAPSULE | ORAL | 0 refills | Status: DC

## 2023-06-14 NOTE — Addendum Note (Signed)
 Addended by: Eden Emms on: 06/14/2023 12:37 PM   Modules accepted: Orders

## 2023-06-20 ENCOUNTER — Inpatient Hospital Stay: Admitting: Oncology

## 2023-06-20 ENCOUNTER — Inpatient Hospital Stay

## 2023-07-11 ENCOUNTER — Encounter: Payer: Self-pay | Admitting: Nurse Practitioner

## 2023-07-11 DIAGNOSIS — H9312 Tinnitus, left ear: Secondary | ICD-10-CM

## 2023-07-12 NOTE — Addendum Note (Signed)
 Addended by: Dorothe Gaster on: 07/12/2023 04:59 PM   Modules accepted: Orders

## 2023-07-16 ENCOUNTER — Telehealth: Admitting: Family Medicine

## 2023-07-16 DIAGNOSIS — R3989 Other symptoms and signs involving the genitourinary system: Secondary | ICD-10-CM

## 2023-07-16 MED ORDER — CEPHALEXIN 500 MG PO CAPS: 500.0000 mg | ORAL_CAPSULE | Freq: Two times a day (BID) | ORAL | 0 refills | Status: AC

## 2023-07-16 NOTE — Progress Notes (Signed)

## 2023-07-23 ENCOUNTER — Inpatient Hospital Stay: Admitting: Oncology

## 2023-07-23 ENCOUNTER — Inpatient Hospital Stay

## 2023-07-25 ENCOUNTER — Telehealth: Admitting: Nurse Practitioner

## 2023-07-25 ENCOUNTER — Encounter

## 2023-07-25 DIAGNOSIS — H60503 Unspecified acute noninfective otitis externa, bilateral: Secondary | ICD-10-CM | POA: Diagnosis not present

## 2023-07-26 ENCOUNTER — Encounter

## 2023-07-26 ENCOUNTER — Telehealth: Admitting: Nurse Practitioner

## 2023-07-26 ENCOUNTER — Other Ambulatory Visit: Payer: Self-pay | Admitting: Nurse Practitioner

## 2023-07-26 ENCOUNTER — Encounter: Payer: Self-pay | Admitting: Nurse Practitioner

## 2023-07-26 ENCOUNTER — Telehealth: Admitting: Family Medicine

## 2023-07-26 ENCOUNTER — Telehealth: Payer: Self-pay

## 2023-07-26 DIAGNOSIS — H6692 Otitis media, unspecified, left ear: Secondary | ICD-10-CM | POA: Diagnosis not present

## 2023-07-26 DIAGNOSIS — H6690 Otitis media, unspecified, unspecified ear: Secondary | ICD-10-CM

## 2023-07-26 DIAGNOSIS — K0889 Other specified disorders of teeth and supporting structures: Secondary | ICD-10-CM

## 2023-07-26 MED ORDER — CIPRO HC 0.2-1 % OT SUSP: 3.0000 [drp] | Freq: Two times a day (BID) | OTIC | 0 refills | Status: DC

## 2023-07-26 MED ORDER — CIPROFLOXACIN-DEXAMETHASONE 0.3-0.1 % OT SUSP: 4.0000 [drp] | Freq: Two times a day (BID) | OTIC | 0 refills | Status: DC

## 2023-07-26 MED ORDER — AMOXICILLIN-POT CLAVULANATE 875-125 MG PO TABS: 1.0000 | ORAL_TABLET | Freq: Two times a day (BID) | ORAL | 0 refills | Status: DC

## 2023-07-26 NOTE — Progress Notes (Signed)
 Virtual Visit Consent   Alisha Beltran, you are scheduled for a virtual visit with a [facility] provider today. Just as with appointments in the office, your consent must be obtained to participate. Your consent will be active for this visit and any virtual visit you may have with one of our providers in the next 365 days. If you have a MyChart account, a copy of this consent can be sent to you electronically.  As this is a virtual visit, video technology does not allow for your provider to perform a traditional examination. This may limit your provider's ability to fully assess your condition. If your provider identifies any concerns that need to be evaluated in person or the need to arrange testing (such as labs, EKG, etc.), we will make arrangements to do so. Although advances in technology are sophisticated, we cannot ensure that it will always work on either your end or our end. If the connection with a video visit is poor, the visit may have to be switched to a telephone visit. With either a video or telephone visit, we are not always able to ensure that we have a secure connection.  By engaging in this virtual visit, you consent to the provision of healthcare and authorize for your insurance to be billed (if applicable) for the services provided during this visit. Depending on your insurance coverage, you may receive a charge related to this service.  I need to obtain your verbal consent now. Are you willing to proceed with your visit today? [name] has provided verbal consent on 07/26/2023 for a virtual visit (video or telephone). Albertha Huger, FNP  Date: 07/26/2023 10:07 AM   Virtual Visit via Video Note   I, Albertha Huger, connected with  Alisha Beltran  ([id number], Mar 05, 1981) on 07/26/23 at 10:00 AM EDT by a video-enabled telemedicine application and verified that I am speaking with the correct person using two identifiers.  Location: Patient: Virtual Visit Location Patient:  Home Provider: Virtual Visit Location Provider: Home Office   I discussed the limitations of evaluation and management by telemedicine and the availability of in person appointments. The patient expressed understanding and agreed to proceed.    History of Present Illness: Alisha Beltran is a 43 y.o. who identifies as a female who was assigned female at birth, and is being seen today for left ear pain with canal swollen and yellow drainage no fever. She also has pain in left lower tooth that is broken. No fever. Aaron Aas  HPI: HPI  Problems:  Patient Active Problem List   Diagnosis Date Noted   Other fatigue 06/12/2023   Witnessed episode of apnea 06/12/2023   Tinnitus of left ear 06/12/2023   Abnormal TSH 08/03/2022   Migraine with aura and without status migrainosus, not intractable 08/03/2022   Preventative health care 03/21/2022   Lower abdominal pain 12/25/2021   Low serum vitamin B12 12/25/2021   Iron  deficiency anemia 12/25/2021   Acute left ankle pain 07/10/2021   Blood in stool 07/10/2021   Atypical chest pain 07/10/2021   Abnormal CBC 07/10/2021   Anxiety and depression 07/10/2021   Vitamin D  deficiency 07/10/2021   Chronic idiopathic constipation 12/30/2019   Seasonal allergic rhinitis 12/30/2019   Morbid obesity with BMI of 50.0-59.9, adult (HCC) 08/14/2017   Essential hypertension 08/14/2017   Morbid obesity (HCC) 08/14/2017    Allergies:  Allergies  Allergen Reactions   Other Hives and Itching    Pet Dander   Amlodipine  Other (See Comments)  Dizziness   Dust Mite Extract     Coughing    Iron  Other (See Comments)    Hot flashes.   Sertraline  Hcl Other (See Comments)    Felt out of it. Lethargic and felt more emotional.   Medications:  Current Outpatient Medications:    acetaminophen  (TYLENOL ) 500 MG tablet, Take 500-1,000 mg by mouth every 6 (six) hours as needed for moderate pain. , Disp: , Rfl:    cetirizine  (ZYRTEC ) 10 MG tablet, Take 1 tablet (10 mg total)  by mouth daily., Disp: 30 tablet, Rfl: 0   ciprofloxacin-hydrocortisone (CIPRO HC) OTIC suspension, Place 3 drops into both ears 2 (two) times daily for 7 days., Disp: 10 mL, Rfl: 0   ferrous sulfate  (SLOW IRON ) 160 (50 Fe) MG TBCR SR tablet, Take 1 tablet (160 mg total) by mouth daily., Disp: 90 tablet, Rfl: 0   ipratropium (ATROVENT ) 0.03 % nasal spray, Place 2 sprays into both nostrils every 12 (twelve) hours., Disp: 30 mL, Rfl: 0   labetalol  (NORMODYNE ) 100 MG tablet, Take 1 tablet (100 mg total) by mouth 2 (two) times daily., Disp: 180 tablet, Rfl: 1   nystatin  cream (MYCOSTATIN ), Apply 1 Application topically 2 (two) times daily., Disp: 60 g, Rfl: 0   SUMAtriptan  (IMITREX ) 25 MG tablet, Take 1 tablet (25MG ) at onset of headache. May repeat 1 tablet (25MG ) in two hours if headache persists., Disp: 10 tablet, Rfl: 1   Vitamin D , Ergocalciferol , (DRISDOL ) 1.25 MG (50000 UNIT) CAPS capsule, Take 1 capsule (50,000 Units total) by mouth every 7 (seven) days., Disp: 12 capsule, Rfl: 0  Observations/Objective: Patient is well-developed, well-nourished in no acute distress.  Resting comfortably  at home.  Head is normocephalic, atraumatic.  No labored breathing.  Speech is clear and coherent with logical content.  Patient is alert and oriented at baseline.    Assessment and Plan: 1. Pain, dental (Primary)  2. Left otitis media, unspecified otitis media type  Heat, ibuprofen  and uc if sx persist or worsen   Follow Up Instructions: I discussed the assessment and treatment plan with the patient. The patient was provided an opportunity to ask questions and all were answered. The patient agreed with the plan and demonstrated an understanding of the instructions.  A copy of instructions were sent to the patient via MyChart unless otherwise noted below.     The patient was advised to call back or seek an in-person evaluation if the symptoms worsen or if the condition fails to improve as  anticipated.    [name], FNP

## 2023-07-26 NOTE — Telephone Encounter (Signed)
 Copied from CRM [id number]. Topic: Clinical - Prescription Issue >> Jul 26, 2023  2:46 PM Tiffany H wrote: Reason for CRM: Patient called to advise that Ciprodex prescribed to patient is not covered by her insurance. It is also not in stock at the local [facility]. Porfirio Bristol would like provide an alternative. He would like approval from prescriber. Please assist.

## 2023-07-26 NOTE — Progress Notes (Signed)
   Thank you for the details you included in the comment boxes. Those details are very helpful in determining the best course of treatment for you and help us  to provide the best care.Because of the severity of your symptoms, we recommend that you schedule a Virtual Urgent Care video visit in order for the provider to better assess what is going on.  The provider will be able to give you a more accurate diagnosis and treatment plan if we can more freely discuss your symptoms and with the addition of a virtual examination.   If you change your visit to a video visit, we will bill your insurance (similar to an office visit) and you will not be charged for this e-Visit. You will be able to stay at home and speak with the first available [facility] advanced practice provider. The link to do a video visit is in the drop down Menu tab of your Welcome screen in MyChart.

## 2023-07-26 NOTE — Progress Notes (Signed)
 E Visit for Ear Pain - Swimmer's Ear  We are sorry that you are not feeling well. Here is how we plan to help!  Based on what you have shared with me it looks like you have Swimmer's Ear.  Swimmer's ear is a redness or swelling, irritation, or infection of your outer ear canal. These symptoms usually occur within a few days of swimming. Your ear canal is a tube that goes from the opening of the ear to the eardrum.  When water stays in your ear canal, germs can grow.  This is a painful condition that often happens to children and swimmers of all ages.  It is not contagious and oral antibiotics are not required to treat uncomplicated swimmer's ear.  The usual symptoms include:    Itchiness inside the ear  Redness or a sense of swelling in the ear  Pain when the ear is tugged on when pressure is placed on the ear  Pus draining from the infected ear     I have prescribed: Ciprofloxin 0.2% and hydrocortisone 1% otic suspension 3 drops in affected ears twice daily for 7 days  In certain cases, swimmer's ear may progress to a more serious bacterial infection of the middle or inner ear.  If you have a fever 102 and up and significantly worsening symptoms, this could indicate a more serious infection moving to the middle/inner and needs face to face evaluation in an office by a provider.  Your symptoms should improve over the next 3 days and should resolve in about 7 days.  Be sure to complete ALL of your prescription.  HOME CARE: Wash your hands frequently. If you are prescribed an ear drop, do not place the tip of the bottle on your ear or touch it with your fingers. You can take Acetaminophen 650 mg every 4-6 hours as needed for pain.  If pain is severe or moderate, you can apply a heating pad (set on low) or hot water bottle (wrapped in a towel) to outer ear for 20 minutes.  This will also increase drainage. Avoid ear plugs Do not go swimming until the symptoms are gone Do not use Q-tips After  showers, help the water run out by tilting your head to one side.   GET HELP RIGHT AWAY IF: Fever is over 102.2 degrees. You develop progressive ear pain or hearing loss. Ear symptoms persist longer than 3 days after treatment.  MAKE SURE YOU: Understand these instructions. Will watch your condition. Will get help right away if you are not doing well or get worse.  TO PREVENT SWIMMER'S EAR: Use a bathing cap or custom fitted swim molds to keep your ears dry. Towel off after swimming to dry your ears. Tilt your head or pull your earlobes to allow the water to escape your ear canal. If there is still water in your ears, consider using a hairdryer on the lowest setting.  Thank you for choosing an e-visit.  Your e-visit answers were reviewed by a board certified advanced clinical practitioner to complete your personal care plan. Depending upon the condition, your plan could have included both over the counter or prescription medications.  Please review your pharmacy choice. Make sure the pharmacy is open so you can pick up the prescription now. If there is a problem, you may contact your provider through [employer] and have the prescription routed to another pharmacy.  Your safety is important to [place]. If you have drug allergies check your prescription carefully.  For the next 24 hours you can use MyChart to ask questions about today's visit, request a non-urgent call back, or ask for a work or school excuse. You will get an email with a survey after your eVisit asking about your experience. We would appreciate your feedback. I hope that your e-visit has been valuable and will aid in your recovery.    have provided 5 minutes of non face to face time during this encounter for chart review and documentation.

## 2023-07-26 NOTE — Patient Instructions (Signed)
 Dental Pain: What It Means  Dental pain is often a sign that something is wrong with your teeth or gums. You can also have pain after a dental treatment. If you have dental pain, it's important to contact your dental care provider, especially if you don't know what's causing the pain.  Dental pain may hurt a lot or a little. It can be caused by many things, including: Tooth decay. This is also called cavities or caries. Infection. An abscess. This is when the inner part of the tooth is filled with pus. An injury or a crack in the tooth. Gum disease or gums that move back and expose the root of a tooth. Abnormal grinding or clenching of teeth. Not taking good care of your teeth. Sometimes the cause of pain isn't known. You may have pain all the time, or it may come and go. It may happen only when you're: Chewing. Exposed to hot or cold temperatures. Eating or drinking foods or drinks with a lot of sugar in them, such as soda or candy. Follow these instructions at home: Medicines Take your medicines only as told. If you were given antibiotics, take them as told. Do not stop taking them even if you start to feel better. Eating and drinking Avoid foods or drinks that cause you pain. These may include: Very hot or very cold foods or drinks. Sweet or sugary foods or drinks. Managing pain and swelling  Use ice or an ice pack on the painful area of your face as told. Put ice in a plastic bag. Place a towel between your skin and the ice. Leave the ice on for 20 minutes, 2-3 times a day. Remove the ice if your skin turns bright red. This is very important. If you cannot feel pain, heat, or cold, you have a greater risk of damage to the area. If your skin turns red, take off the ice right away to prevent skin damage. The risk of damage is higher if you can't feel pain, heat, or cold. Brushing and flossing Brush your teeth twice a day using a fluoride toothpaste. Use a toothpaste made for  sensitive teeth as told by your dental care provider. Always use a soft toothbrush. This will help prevent irritation of your gums. Floss your teeth at least once a day. General instructions Do not apply heat to the outside of your face. To cleanse your mouth and help with any irritation and swelling in the painful area, you can try rinsing with salt water. Swish and gargle with salt water and then spit it out. To make salt water, add a half to a whole spoonful of salt to a glass of warm water. Mix well. You can repeat this every 2-3 hours for pain. Contact a dental care provider if: You have dental pain and you don't know why. Your pain isn't controlled with medicines. Your symptoms get worse. You have new symptoms. Get help right away if: You can't open your mouth. You're having trouble breathing or swallowing. You have a fever. Your face, neck, or jaw is swollen. These symptoms may be an emergency. Call 911 right away. Do not wait to see if the symptoms will go away. Do not drive yourself to the hospital. This information is not intended to replace advice given to you by your health care provider. Make sure you discuss any questions you have with your health care provider. Document Revised: 01/08/2023 Document Reviewed: 07/26/2022 Elsevier Patient Education  2024 [employer]Otitis Externa  Otitis externa is an infection of the outer ear canal. The outer ear canal is the area between the outside of the ear and the eardrum. Otitis externa is sometimes called swimmer's ear. What are the causes? Common causes of this condition include: Swimming in dirty water. Moisture in the ear. An injury to the inside of the ear. An object stuck in the ear. A cut or scrape on the outside of the ear or in the ear canal. What increases the risk? You are more likely to develop this condition if you go swimming often. What are the signs or symptoms? The first symptom of this condition is often  itching in the ear. Later symptoms of the condition include: Swelling of the ear. Redness in the ear. Ear pain. The pain may get worse when you pull on your ear. Pus coming from the ear. How is this diagnosed? This condition may be diagnosed by examining the ear and testing fluid from the ear for bacteria and funguses. How is this treated? This condition may be treated with: Antibiotic ear drops. These are often given for 10-14 days. Medicines to reduce itching and swelling. Follow these instructions at home: If you were prescribed antibiotic ear drops, use them as told by your health care provider. Do not stop using the antibiotic even if you start to feel better. Take over-the-counter and prescription medicines only as told by your health care provider. Avoid getting water in your ears as told by your health care provider. This may include avoiding swimming or water sports for a few days. Keep all follow-up visits. This is important. How is this prevented? Keep your ears dry. Use the corner of a towel to dry your ears after you swim or bathe. Avoid scratching or putting things in your ear. Doing these things can damage the ear canal or remove the protective wax that lines it, which makes it easier for bacteria and funguses to grow. Avoid swimming in lakes, polluted water, or swimming pools that may not have enough chlorine. Contact a health care provider if: You have a fever. Your ear is still red, swollen, painful, or draining pus after 3 days. Your redness, swelling, or pain gets worse. You have a severe headache. Get help right away if: You have redness, swelling, and pain or tenderness in the area behind your ear. Summary Otitis externa is an infection of the outer ear canal. Common causes include swimming in dirty water, moisture in the ear, or a cut or scrape in the ear. Symptoms include pain, redness, and swelling of the ear canal. If you were prescribed antibiotic ear drops,  use them as told by your health care provider. Do not stop using the antibiotic even if you start to feel better. This information is not intended to replace advice given to you by your health care provider. Make sure you discuss any questions you have with your health care provider. Document Revised: 05/11/2020 Document Reviewed: 05/11/2020 Elsevier Patient Education  2024 [employer].

## 2023-07-26 NOTE — Telephone Encounter (Signed)
 Sent mychart message to pt regarding medication out of stock and message to prescriber.

## 2023-07-26 NOTE — Telephone Encounter (Signed)
 See message below about medication being out of stock  [facility] pool- let patient know I have messaged the prescriber that she was evaluated by

## 2023-07-27 ENCOUNTER — Telehealth: Admitting: Emergency Medicine

## 2023-07-27 DIAGNOSIS — H60503 Unspecified acute noninfective otitis externa, bilateral: Secondary | ICD-10-CM

## 2023-07-27 MED ORDER — NEOMYCIN-POLYMYXIN-HC 3.5-10000-1 OT SOLN: 4.0000 [drp] | Freq: Four times a day (QID) | OTIC | 0 refills | Status: DC

## 2023-07-27 NOTE — Progress Notes (Signed)
 Ciprodex  unavailable and too expensive. Sent in cortisporin otic

## 2023-08-08 ENCOUNTER — Encounter: Payer: Self-pay | Admitting: Emergency Medicine

## 2023-08-08 ENCOUNTER — Inpatient Hospital Stay

## 2023-08-08 ENCOUNTER — Ambulatory Visit
Admission: EM | Admit: 2023-08-08 | Discharge: 2023-08-08 | Disposition: A | Discharge status: Home / Self Care | Attending: Physician Assistant | Admitting: Physician Assistant

## 2023-08-08 ENCOUNTER — Inpatient Hospital Stay: Admitting: Oncology

## 2023-08-08 DIAGNOSIS — R109 Unspecified abdominal pain: Secondary | ICD-10-CM

## 2023-08-08 DIAGNOSIS — M79602 Pain in left arm: Secondary | ICD-10-CM

## 2023-08-08 DIAGNOSIS — M79605 Pain in left leg: Secondary | ICD-10-CM | POA: Diagnosis not present

## 2023-08-08 LAB — POCT URINALYSIS DIP (MANUAL ENTRY)
Bilirubin, UA: NEGATIVE
Blood, UA: NEGATIVE
Glucose, UA: NEGATIVE mg/dL
Ketones, POC UA: NEGATIVE mg/dL
Leukocytes, UA: NEGATIVE
Nitrite, UA: NEGATIVE
Protein Ur, POC: 30 mg/dL — AB
Spec Grav, UA: 1.025 (ref 1.010–1.025)
Urobilinogen, UA: 0.2 U/dL
pH, UA: 6 (ref 5.0–8.0)

## 2023-08-08 MED ORDER — KETOROLAC TROMETHAMINE 60 MG/2ML IM SOLN
30.0000 mg | Freq: Once | INTRAMUSCULAR | Status: AC
  Administered 2023-08-08: 30 mg via INTRAMUSCULAR

## 2023-08-08 NOTE — ED Notes (Signed)
 Patient is being discharged from the Urgent Care and sent to the Emergency Department via POV . Per [place], [place], patient is in need of higher level of care due to need for further evaluation of unilateral back pain and swelling of extremities. Patient is aware and verbalizes understanding of plan of care.  Vitals:   08/08/23 1848  BP: (!) 165/111  Pulse: 78  Resp: 20  Temp: 98 F (36.7 C)  SpO2: 98%

## 2023-08-08 NOTE — ED Provider Notes (Signed)
 [name]    CSN: [id number] Arrival date & time: 08/08/23  1813      History   Chief Complaint Chief Complaint  Patient presents with   Back Pain   Leg Swelling   Arm Swelling    HPI Alisha Beltran is a 43 y.o. female.   Patient complains of severe pain in her left flank area that began yesterday.  Patient reports pain comes and goes.  Patient reports pain is currently very severe.  Patient is crying in pain.  Patient reports she is also had discomfort in her left leg and her left arm.  Patient reports moving does not make the pain worse.  She denies any nausea or vomiting she is not having abdominal pain.  Patient denies any UTI symptoms she is not having burning with urination.  Patient has a past medical history of hypertension.  Patient denies any shortness of breath.  She has not had any prolonged sitting.  Patient denies any chest pain  The history is provided by the patient. No language interpreter was used.  Back Pain   Past Medical History:  Diagnosis Date   Allergy    Hypertension     Patient Active Problem List   Diagnosis Date Noted   Other fatigue 06/12/2023   Witnessed episode of apnea 06/12/2023   Tinnitus of left ear 06/12/2023   Abnormal TSH 08/03/2022   Migraine with aura and without status migrainosus, not intractable 08/03/2022   Preventative health care 03/21/2022   Lower abdominal pain 12/25/2021   Low serum vitamin B12 12/25/2021   Iron  deficiency anemia 12/25/2021   Acute left ankle pain 07/10/2021   Blood in stool 07/10/2021   Atypical chest pain 07/10/2021   Abnormal CBC 07/10/2021   Anxiety and depression 07/10/2021   Vitamin D  deficiency 07/10/2021   Chronic idiopathic constipation 12/30/2019   Seasonal allergic rhinitis 12/30/2019   Morbid obesity with BMI of 50.0-59.9, adult (HCC) 08/14/2017   Essential hypertension 08/14/2017   Morbid obesity (HCC) 08/14/2017    Past Surgical History:  Procedure Laterality Date    NO PAST SURGERIES      OB History   No obstetric history on file.      Home Medications    Prior to Admission medications   Medication Sig Start Date End Date Taking? Authorizing Provider  acetaminophen  (TYLENOL ) 500 MG tablet Take 500-1,000 mg by mouth every 6 (six) hours as needed for moderate pain.    Yes [provider]  neomycin -polymyxin-hydrocortisone (CORTISPORIN) OTIC solution Place 4 drops into both ears 4 (four) times daily. 07/27/23  Yes Blinda Burger, NP  amoxicillin -clavulanate (AUGMENTIN ) 875-125 MG tablet Take 1 tablet by mouth 2 (two) times daily. Patient not taking: Reported on 08/08/2023 07/26/23   Mardene Shake, FNP  cetirizine  (ZYRTEC ) 10 MG tablet Take 1 tablet (10 mg total) by mouth daily. Patient not taking: Reported on 08/08/2023 01/23/22   Dodson Freestone, FNP  ferrous sulfate  (SLOW IRON ) 160 (50 Fe) MG TBCR SR tablet Take 1 tablet (160 mg total) by mouth daily. Patient not taking: Reported on 08/08/2023 08/09/22   Dorothe Gaster, NP  ipratropium (ATROVENT ) 0.03 % nasal spray Place 2 sprays into both nostrils every 12 (twelve) hours. Patient not taking: Reported on 08/08/2023 04/17/23   Farris Hong, PA-C  labetalol  (NORMODYNE ) 100 MG tablet Take 1 tablet (100 mg total) by mouth 2 (two) times daily. 06/12/23   Dorothe Gaster, NP  nystatin  cream (MYCOSTATIN ) Apply  1 Application topically 2 (two) times daily. Patient not taking: Reported on 08/08/2023 07/06/22   Angelia Kelp, PA-C  SUMAtriptan  (IMITREX ) 25 MG tablet Take 1 tablet (25MG ) at onset of headache. May repeat 1 tablet (25MG ) in two hours if headache persists. Patient not taking: Reported on 08/08/2023 06/12/23   Dorothe Gaster, NP  Vitamin D , Ergocalciferol , (DRISDOL ) 1.25 MG (50000 UNIT) CAPS capsule Take 1 capsule (50,000 Units total) by mouth every 7 (seven) days. Patient not taking: Reported on 08/08/2023 06/13/23   Dorothe Gaster, NP    Family History Family History  Problem Relation Age  of Onset   Cancer Mother 35       lung   Early death Mother 22   Heart attack Father    Hypertension Father     Social History Social History   Tobacco Use   Smoking status: Never   Smokeless tobacco: Never  Vaping Use   Vaping status: Never Used  Substance Use Topics   Alcohol use: No   Drug use: Never     Allergies   Other, Amlodipine , Dust mite extract, Iron , and Sertraline  hcl   Review of Systems Review of Systems  Musculoskeletal:  Positive for back pain.  All other systems reviewed and are negative.    Physical Exam Triage Vital Signs ED Triage Vitals [08/08/23 1848]  Encounter Vitals Group     BP (!) 165/111     Systolic BP Percentile      Diastolic BP Percentile      Pulse Rate 78     Resp 20     Temp 98 F (36.7 C)     Temp Source Oral     SpO2 98 %     Weight      Height      Head Circumference      Peak Flow      Pain Score 8     Pain Loc      Pain Education      Exclude from Growth Chart    No data found.  Updated Vital Signs BP (!) 165/111 (BP Location: Right Wrist) Comment: not taking BP meds, needs refill  Pulse 78   Temp 98 F (36.7 C) (Oral)   Resp 20   LMP 07/29/2023 (Approximate)   SpO2 98%   Visual Acuity Right Eye Distance:   Left Eye Distance:   Bilateral Distance:    Right Eye Near:   Left Eye Near:    Bilateral Near:     Physical Exam Vitals and nursing note reviewed.  Constitutional:      Appearance: She is well-developed.  HENT:     Head: Normocephalic.  Cardiovascular:     Rate and Rhythm: Normal rate.  Pulmonary:     Effort: Pulmonary effort is normal.  Abdominal:     General: There is no distension.  Musculoskeletal:        General: Normal range of motion.  Skin:    General: Skin is warm.  Neurological:     General: No focal deficit present.     Mental Status: She is alert and oriented to person, place, and time.      UC Treatments / Results  Labs (all labs ordered are listed, but only  abnormal results are displayed) Labs Reviewed  POCT URINALYSIS DIP (MANUAL ENTRY) - Abnormal; Notable for the following components:      Result Value   Protein Ur, POC =30 (*)    All  other components within normal limits    EKG   Radiology No results found.  Procedures Procedures (including critical care time)  Medications Ordered in UC Medications  ketorolac (TORADOL) injection 30 mg (30 mg Intramuscular Given 08/08/23 1925)    Initial Impression / Assessment and Plan / UC Course  I have reviewed the triage vital signs and the nursing notes.  Pertinent labs & imaging results that were available during my care of the patient were reviewed by me and considered in my medical decision making (see chart for details).     Patient given Toradol 30 mg IM.  UA shows protein no blood, no leukocytes no nitrates. Patient reports experiencing some pain relief with Toradol.  Patient may have a kidney stone due to waxing and waning of pain and severity.  Options discussed with the patient she plans to go to the emergency department due to continued discomfort. Final Clinical Impressions(s) / UC Diagnoses   Final diagnoses:  Left leg pain  Left arm pain  Acute left flank pain   Discharge Instructions      Go to the Emergency department for evaluation   ED Prescriptions   None    PDMP not reviewed this encounter. An After Visit Summary was printed and given to the patient.    Sandi Crosby, PA-C 08/08/23 2017

## 2023-08-08 NOTE — ED Triage Notes (Addendum)
 Pt reports new L-sided mid-back pain that started yesterday afternoon. Notes pain is intermittent with last episode 1hr ago. Pt notes aching pain to the area and notes aching pain in L arm and leg. She also notes it feels like her arm and leg on L side are swelling during pain episodes. No notable swelling seen comparing left to right side in triage. Pain started back up while in triage and pt is in tears due to pain in her back.

## 2023-08-08 NOTE — Discharge Instructions (Addendum)
Go to the Emergency department for evaluation

## 2023-09-27 ENCOUNTER — Telehealth: Admitting: Physician Assistant

## 2023-09-27 DIAGNOSIS — R21 Rash and other nonspecific skin eruption: Secondary | ICD-10-CM

## 2023-09-27 MED ORDER — NYSTATIN-TRIAMCINOLONE 100000-0.1 UNIT/GM-% EX CREA: 1.0000 | TOPICAL_CREAM | Freq: Two times a day (BID) | CUTANEOUS | 0 refills | Status: DC

## 2023-09-27 NOTE — Progress Notes (Signed)
 E Visit for Rash  We are sorry that you are not feeling well. Here is how we plan to help!  Based upon your presentation it appears you have a fungal infection.  I have prescribed: and Nystatin -Triamcinolone cream apply to the affected area twice daily for up to 14 days   HOME CARE:  Take cool showers and avoid direct sunlight. Apply cool compress or wet dressings. Take a bath in an oatmeal bath.  Sprinkle content of one Aveeno packet under running faucet with comfortably warm water.  Bathe for 15-20 minutes, 1-2 times daily.  Pat dry with a towel. Do not rub the rash. Use hydrocortisone cream. Take an antihistamine like Benadryl for widespread rashes that itch.  The adult dose of Benadryl is 25-50 mg by mouth 4 times daily. Caution:  This type of medication may cause sleepiness.  Do not drink alcohol, drive, or operate dangerous machinery while taking antihistamines.  Do not take these medications if you have prostate enlargement.  Read package instructions thoroughly on all medications that you take.  GET HELP RIGHT AWAY IF:  Symptoms don't go away after treatment. Severe itching that persists. If you rash spreads or swells. If you rash begins to smell. If it blisters and opens or develops a yellow-brown crust. You develop a fever. You have a sore throat. You become short of breath.  MAKE SURE YOU:  Understand these instructions. Will watch your condition. Will get help right away if you are not doing well or get worse.  Thank you for choosing an e-visit.  Your e-visit answers were reviewed by a board certified advanced clinical practitioner to complete your personal care plan. Depending upon the condition, your plan could have included both over the counter or prescription medications.  Please review your pharmacy choice. Make sure the pharmacy is open so you can pick up prescription now. If there is a problem, you may contact your provider through [employer] and have  the prescription routed to another pharmacy.  Your safety is important to us . If you have drug allergies check your prescription carefully.   For the next 24 hours you can use MyChart to ask questions about today's visit, request a non-urgent call back, or ask for a work or school excuse. You will get an email in the next two days asking about your experience. I hope that your e-visit has been valuable and will speed your recovery.    I have spent 5 minutes in review of e-visit questionnaire, review and updating patient chart, medical decision making and response to patient.   Delon CHRISTELLA Dickinson, PA-C

## 2023-10-02 ENCOUNTER — Telehealth: Admitting: Physician Assistant

## 2023-10-02 DIAGNOSIS — S025XXA Fracture of tooth (traumatic), initial encounter for closed fracture: Secondary | ICD-10-CM

## 2023-10-02 NOTE — Progress Notes (Signed)
  Because of severity of pain and recent treatment for dental infection with risk of more resistant infection now, I feel your condition warrants further evaluation and I recommend that you be seen in a face-to-face visit.   NOTE: There will be NO CHARGE for this E-Visit   If you are having a true medical emergency, please call 911.     For an urgent face to face visit, [place] has multiple urgent care centers for your convenience.  Click the link below for the full list of locations and hours, walk-in wait times, appointment scheduling options and driving directions:  Urgent Care - [place], [place], [place], [place], [place], [place]  [place]     Your MyChart E-visit questionnaire answers were reviewed by a board certified advanced clinical practitioner to complete your personal care plan based on your specific symptoms.    Thank you for using e-Visits.

## 2023-12-19 ENCOUNTER — Telehealth: Payer: Self-pay | Admitting: Physician Assistant

## 2023-12-19 DIAGNOSIS — L710 Perioral dermatitis: Secondary | ICD-10-CM

## 2023-12-19 MED ORDER — HYDROXYZINE PAMOATE 25 MG PO CAPS: 25.0000 mg | ORAL_CAPSULE | Freq: Three times a day (TID) | ORAL | 0 refills | Status: DC | PRN

## 2023-12-19 MED ORDER — DOXYCYCLINE HYCLATE 100 MG PO TABS: 100.0000 mg | ORAL_TABLET | Freq: Two times a day (BID) | ORAL | 0 refills | Status: AC

## 2023-12-19 NOTE — Progress Notes (Signed)
 E Visit for Rash  We are sorry that you are not feeling well. Here is how we plan to help!  Based on what you have shared with me, I am concerned for a perioral dermatitis. Please keep skin clean and dry. Limit makeup applied to the area. Avoid use of topical steroid creams. I have prescribed Doxycycline to take twice daily for 2 weeks. I have also sent in Hydroxyzine to help with itching and irritation. When you wash face, wash with warm but not hot water and pat dry, instead of rubbing dry. You can use a gentle moisturizer like Cetaphil if needed.  Sometimes this may take a longer course of treatment. I want you to schedule a follow-up with your PCP.  HOME CARE:  Take cool showers and avoid direct sunlight. Apply cool compress or wet dressings. Take a bath in an oatmeal bath.  Sprinkle content of one Aveeno packet under running faucet with comfortably warm water.  Bathe for 15-20 minutes, 1-2 times daily.  Pat dry with a towel. Do not rub the rash. Use hydrocortisone cream. Take an antihistamine like Benadryl for widespread rashes that itch.  The adult dose of Benadryl is 25-50 mg by mouth 4 times daily. Caution:  This type of medication may cause sleepiness.  Do not drink alcohol, drive, or operate dangerous machinery while taking antihistamines.  Do not take these medications if you have prostate enlargement.  Read package instructions thoroughly on all medications that you take.  GET HELP RIGHT AWAY IF:  Symptoms don't go away after treatment. Severe itching that persists. If you rash spreads or swells. If you rash begins to smell. If it blisters and opens or develops a yellow-brown crust. You develop a fever. You have a sore throat. You become short of breath.  MAKE SURE YOU:  Understand these instructions. Will watch your condition. Will get help right away if you are not doing well or get worse.  Thank you for choosing an e-visit. Your e-visit answers were reviewed by a board  certified advanced clinical practitioner to complete your personal care plan. Depending upon the condition, your plan could have included both over the counter or prescription medications. Please review your pharmacy choice. Be sure that the pharmacy you have chosen is open so that you can pick up your prescription now.  If there is a problem you may message your provider in MyChart to have the prescription routed to another pharmacy. Your safety is important to us . If you have drug allergies check your prescription carefully.  For the next 24 hours, you can use MyChart to ask questions about today's visit, request a non-urgent call back, or ask for a work or school excuse from your e-visit provider. You will get an email in the next two days asking about your experience. I hope that your e-visit has been valuable and will speed your recovery.  I have spent 5 minutes in review of e-visit questionnaire, review and updating patient chart, medical decision making and response to patient.   Elsie Velma Lunger, PA-C

## 2024-01-01 ENCOUNTER — Encounter: Payer: Self-pay | Admitting: Nurse Practitioner

## 2024-01-28 ENCOUNTER — Other Ambulatory Visit: Payer: Self-pay

## 2024-01-28 ENCOUNTER — Emergency Department (HOSPITAL_COMMUNITY): Payer: Self-pay

## 2024-01-28 ENCOUNTER — Encounter (HOSPITAL_COMMUNITY): Payer: Self-pay

## 2024-01-28 ENCOUNTER — Emergency Department (HOSPITAL_COMMUNITY)
Admission: EM | Admit: 2024-01-28 | Discharge: 2024-01-28 | Disposition: A | Discharge status: Home / Self Care | Payer: Self-pay | Attending: Emergency Medicine | Admitting: Emergency Medicine

## 2024-01-28 DIAGNOSIS — I10 Essential (primary) hypertension: Secondary | ICD-10-CM | POA: Insufficient documentation

## 2024-01-28 DIAGNOSIS — D649 Anemia, unspecified: Secondary | ICD-10-CM | POA: Insufficient documentation

## 2024-01-28 DIAGNOSIS — R079 Chest pain, unspecified: Secondary | ICD-10-CM

## 2024-01-28 LAB — CBC
HCT: 30.9 % — ABNORMAL LOW (ref 36.0–46.0)
Hemoglobin: 9.5 g/dL — ABNORMAL LOW (ref 12.0–15.0)
MCH: 23.2 pg — ABNORMAL LOW (ref 26.0–34.0)
MCHC: 30.7 g/dL (ref 30.0–36.0)
MCV: 75.4 fL — ABNORMAL LOW (ref 80.0–100.0)
Platelets: 456 K/uL — ABNORMAL HIGH (ref 150–400)
RBC: 4.1 MIL/uL (ref 3.87–5.11)
RDW: 16.8 % — ABNORMAL HIGH (ref 11.5–15.5)
WBC: 8.7 K/uL (ref 4.0–10.5)
nRBC: 0 % (ref 0.0–0.2)

## 2024-01-28 LAB — BASIC METABOLIC PANEL WITH GFR
Anion gap: 8 (ref 5–15)
BUN: 8 mg/dL (ref 6–20)
CO2: 23 mmol/L (ref 22–32)
Calcium: 8.9 mg/dL (ref 8.9–10.3)
Chloride: 107 mmol/L (ref 98–111)
Creatinine, Ser: 0.7 mg/dL (ref 0.44–1.00)
GFR, Estimated: >60 mL/min (ref >60)
Glucose, Bld: 90 mg/dL (ref 70–99)
Potassium: 3.8 mmol/L (ref 3.5–5.1)
Sodium: 137 mmol/L (ref 135–145)

## 2024-01-28 LAB — HCG, SERUM, QUALITATIVE: Preg, Serum: NEGATIVE

## 2024-01-28 LAB — TROPONIN T, HIGH SENSITIVITY: Troponin T High Sensitivity: <15 ng/L (ref 0–19)

## 2024-01-28 MED ORDER — ONDANSETRON 4 MG PO TBDP
4.0000 mg | ORAL_TABLET | Freq: Once | ORAL | Status: AC
  Administered 2024-01-28: 4 mg via ORAL
  Filled 2024-01-28: qty 1

## 2024-01-28 MED ORDER — ACETAMINOPHEN 325 MG PO TABS
650.0000 mg | ORAL_TABLET | Freq: Once | ORAL | Status: AC
  Administered 2024-01-28: 650 mg via ORAL
  Filled 2024-01-28: qty 2

## 2024-01-28 NOTE — Discharge Instructions (Signed)
 You were seen in the emergency department for your chest pain.  Your work-up showed no signs of heart attack, pneumonia, or fluid on your lungs.  It is unclear the cause of your chest pain at this time, but you can follow-up with your primary doctor to have your symptoms rechecked.  You have also been given a referral to cardiology and they should be calling you to schedule follow-up appointment, however if you do not hear from them in the next few days you should call their office to get your appointment scheduled.  You should return to the emergency department if your pain significantly worsens, you have worsening shortness of breath, you pass out, or if you have any other new or concerning symptoms.

## 2024-01-28 NOTE — ED Notes (Signed)
 I called lab due to no results, Lab stated that they just received the lab work around 15 minutes ago. Lab work was sent at [facility].

## 2024-01-28 NOTE — ED Triage Notes (Addendum)
 Chest pain/heaviness that started yesterday, went away, and came back today, worse on the right side with deep breathing. SOB with exertion. C/o non-productive cough. Pt missed dose of BP medication today

## 2024-01-28 NOTE — ED Notes (Signed)
 BP was high upon discharge, pt has a hx of HTN and has not taken her night time dose of medication. PA advised for her to take it when she gets home, discharge would still be okay.

## 2024-01-28 NOTE — ED Provider Notes (Signed)
 [place] EMERGENCY DEPARTMENT AT [facility] Provider Note   CSN: [id number] Arrival date & time: 01/28/24  1950     Patient presents with: Chest Pain   Alisha Beltran is a 43 y.o. female.   Patient is a 43 year old female with a past medical history of hypertension presenting to the emergency department with chest pain.  The patient states that she developed a heaviness in her chest yesterday while walking.  She states that it lasted about an hour and resolved on its own.  She states that the heaviness returned today and then started to turn into more of a sharp pain on the right side of her chest.  She states that she is having pain with taking deep breaths.  She states that she has chronic swelling in her left leg that is unchanged from her normal.  She denies any fever or cough.  She states that she has some mild nausea, denies any vomiting or diaphoresis.  She denies any history of VTE, any recent hospitalization or surgery, any recent long travel car plane, hormone use or cancer history.  The history is provided by the patient and the spouse.  Chest Pain      Prior to Admission medications   Medication Sig Start Date End Date Taking? Authorizing Provider  hydrOXYzine (VISTARIL) 25 MG capsule Take 1 capsule (25 mg total) by mouth every 8 (eight) hours as needed. 12/19/23   Gladis Elsie BROCKS, PA-C  labetalol  (NORMODYNE ) 100 MG tablet Take 1 tablet (100 mg total) by mouth 2 (two) times daily. 06/12/23   Wendee Lynwood HERO, NP    Allergies: Other, Amlodipine , Dust mite extract, Iron , and Sertraline  hcl    Review of Systems  Cardiovascular:  Positive for chest pain.    Updated Vital Signs BP (!) 153/140   Pulse 86   Temp 99 F (37.2 C) (Oral)   Resp 18   Ht 5' 4 (1.626 m)   Wt 136.1 kg   LMP 01/11/2024 (Approximate)   SpO2 97%   BMI 51.49 kg/m   Physical Exam Vitals and nursing note reviewed.  Constitutional:      General: She is not in acute distress.     Appearance: She is well-developed. She is obese.  HENT:     Head: Normocephalic.  Eyes:     Extraocular Movements: Extraocular movements intact.  Cardiovascular:     Rate and Rhythm: Normal rate and regular rhythm.     Pulses:          Radial pulses are 2+ on the right side and 2+ on the left side.     Heart sounds: Normal heart sounds.  Pulmonary:     Effort: Pulmonary effort is normal.     Breath sounds: Normal breath sounds.  Chest:     Chest wall: Tenderness (Right sided chest wall) present.  Abdominal:     Palpations: Abdomen is soft.     Tenderness: There is no abdominal tenderness.  Musculoskeletal:        General: Normal range of motion.     Cervical back: Normal range of motion and neck supple.     Right lower leg: No edema.     Left lower leg: No edema.  Skin:    General: Skin is warm and dry.  Neurological:     General: No focal deficit present.     Mental Status: She is alert and oriented to person, place, and time.  Psychiatric:  Mood and Affect: Mood normal.        Behavior: Behavior normal.     (all labs ordered are listed, but only abnormal results are displayed) Labs Reviewed  CBC - Abnormal; Notable for the following components:      Result Value   Hemoglobin 9.5 (*)    HCT 30.9 (*)    MCV 75.4 (*)    MCH 23.2 (*)    RDW 16.8 (*)    Platelets 456 (*)    All other components within normal limits  BASIC METABOLIC PANEL WITH GFR  HCG, SERUM, QUALITATIVE  TROPONIN T, HIGH SENSITIVITY    EKG: EKG Interpretation Date/Time:  Tuesday January 28 2024 20:17:42 EST Ventricular Rate:  76 PR Interval:  156 QRS Duration:  101 QT Interval:  389 QTC Calculation: 438 R Axis:   41  Text Interpretation: Sinus rhythm No significant change since last tracing Confirmed by Ellouise Fine ([id number]) on 01/28/2024 9:05:16 PM  Radiology: [facility] Chest 2 View Result Date: 01/28/2024 CLINICAL DATA:  Pain under right breast for 24 hours. EXAM: CHEST - 2 VIEW  COMPARISON:  June 09, 2017 FINDINGS: The heart size and mediastinal contours are within normal limits. Both lungs are clear. The visualized skeletal structures are unremarkable. IMPRESSION: No active cardiopulmonary disease. Electronically Signed   By: Suzen Dials M.D.   On: 01/28/2024 21:13     Procedures   Medications Ordered in the ED  ondansetron  (ZOFRAN -ODT) disintegrating tablet 4 mg (4 mg Oral Given 01/28/24 2135)  acetaminophen  (TYLENOL ) tablet 650 mg (650 mg Oral Given 01/28/24 2134)    Clinical Course as of 01/28/24 2319  Tue Jan 28, 2024  2235 Initial troponin negative. Symptoms ongoing for >4 hours so single troponin is sufficient. No acute disease on CXR. Mildly worsening anemia from baseline, will be recommended outpatient follow up. With exertional chest pain will be given outpatient cardiology referral.  [VK]    Clinical Course User Index [VK] Kingsley, Tenesia Escudero K, DO                                 Medical Decision Making This patient presents to the ED with chief complaint(s) of chest pain with pertinent past medical history of hypertension which further complicates the presenting complaint. The complaint involves an extensive differential diagnosis and also carries with it a high risk of complications and morbidity.    The differential diagnosis includes ACS, arrhythmia, anemia, pneumonia, pneumothorax, pulmonary edema, pleural effusion, she is PERC negative making PE unlikely, MSK pain, gastritis, GERD  Additional history obtained: Additional history obtained from spouse Records reviewed Primary Care Documents  ED Course and Reassessment: On patient's arrival she was initially hypertensive in triage, blood pressure improved to the 150 systolic on her arrival in the room in no acute distress.  EKG on arrival showed normal sinus rhythm without acute ischemic changes.  She had labs including troponin and chest x-ray ordered from triage.  Will be given pain and  nausea control and will be closely reassessed.  Independent labs interpretation:  The following labs were independently interpreted: mild anemia compared to previous labs, otherwise within normal range  Independent visualization of imaging: - I independently visualized the following imaging with scope of interpretation limited to determining acute life threatening conditions related to emergency care: CXR, which revealed no acute disease  Consultation: - Consulted or discussed management/test interpretation w/ external professional: N/A  Consideration for admission  or further workup: Patient has no emergent conditions requiring admission or further work-up at this time and is stable for discharge home with primary care and cardiology follow-up  Social Determinants of health: N/A    Amount and/or Complexity of Data Reviewed Labs: ordered. Radiology: ordered.  Risk OTC drugs. Prescription drug management.       Final diagnoses:  Nonspecific chest pain  Anemia, unspecified type    ED Discharge Orders          Ordered    Ambulatory referral to Cardiology       Comments: If you have not heard from the Cardiology office within the next 72 hours please call [phone].   01/28/24 2317               Kingsley, Arlo Buffone K, DO 01/28/24 2319

## 2024-02-12 ENCOUNTER — Ambulatory Visit: Payer: Self-pay

## 2024-02-12 NOTE — Telephone Encounter (Signed)
 Will see patient then Agree with ER and UC precautions

## 2024-02-12 NOTE — Telephone Encounter (Signed)
 Noted

## 2024-02-12 NOTE — Telephone Encounter (Signed)
 FYI Only or Action Required?: FYI only for provider: appointment scheduled on 02/14/2024 at 8 AM.  Patient was last seen in primary care on 07/26/2023 by Almeda Loa ORN, FNP.  Called Nurse Triage reporting Shortness of Breath.  Symptoms began several weeks ago.  Interventions attempted: Rest, hydration, or home remedies.  Symptoms are: stable.  Triage Disposition: See PCP Within 2 Weeks  Patient/caregiver understands and will follow disposition?: Yes  Copied from CRM [id number]. Topic: Clinical - Red Word Triage >> Feb 12, 2024 11:16 AM Suzen RAMAN wrote: Red Word that prompted transfer to Nurse Triage: Intermittent SOB; requesting an appt for Friday.(Day off from work) Reason for Disposition  [1] MILD longstanding difficulty breathing (e.g., minimal/no SOB at rest, SOB with walking, pulse < 100) AND [2] SAME as normal  Answer Assessment - Initial Assessment Questions Patient reports she was seen at [facility] on 01/28/2024 for chest pain and shortness of breath. Patient states she was told her symptoms could be related to her blood pressure. Reports intermittent shortness of breath currently. States will happen when she is walking but doesn't happen every time. No SOB or CP currently. Patient is asking to be seen in the office. Scheduled to see another provider on 02/14/2024 at 8 AM.   1. RESPIRATORY STATUS: Describe your breathing? (e.g., wheezing, shortness of breath, unable to speak, severe coughing)      Shortness of breath and coughing 2. ONSET: When did this breathing problem begin?      Started on 01/28/2024 3. PATTERN Does the difficult breathing come and go, or has it been constant since it started?      Comes and goes-very randomly per patient reports. States happens when she is walking 4. SEVERITY: How bad is your breathing? (e.g., mild, moderate, severe)      No shortness of breath currently 5. RECURRENT SYMPTOM: Have you had difficulty breathing before? If Yes,  ask: When was the last time? and What happened that time?      no 6. CARDIAC HISTORY: Do you have any history of heart disease? (e.g., heart attack, angina, bypass surgery, angioplasty)      yes 7. LUNG HISTORY: Do you have any history of lung disease?  (e.g., pulmonary embolus, asthma, emphysema)     no 8. CAUSE: What do you think is causing the breathing problem?      unsure 9. OTHER SYMPTOMS: Do you have any other symptoms? (e.g., chest pain, cough, dizziness, fever, runny nose)     cough 10. O2 SATURATION MONITOR:  Do you use an oxygen saturation monitor (pulse oximeter) at home? If Yes, ask: What is your reading (oxygen level) today? What is your usual oxygen saturation reading? (e.g., 95%)       N/A 11. PREGNANCY: Is there any chance you are pregnant? When was your last menstrual period?       no 12. TRAVEL: Have you traveled out of the country in the last month? (e.g., travel history, exposures)       no  Protocols used: Breathing Difficulty-A-AH

## 2024-02-14 ENCOUNTER — Encounter: Payer: Self-pay | Admitting: Family Medicine

## 2024-02-14 ENCOUNTER — Telehealth: Payer: Self-pay | Admitting: Nurse Practitioner

## 2024-02-14 ENCOUNTER — Ambulatory Visit: Payer: Self-pay | Admitting: Family Medicine

## 2024-02-14 VITALS — BP 146/82 | HR 84 | Temp 98.2°F | Ht 64.0 in | Wt 312.2 lb

## 2024-02-14 DIAGNOSIS — R0602 Shortness of breath: Secondary | ICD-10-CM | POA: Insufficient documentation

## 2024-02-14 DIAGNOSIS — D509 Iron deficiency anemia, unspecified: Secondary | ICD-10-CM

## 2024-02-14 DIAGNOSIS — I10 Essential (primary) hypertension: Secondary | ICD-10-CM

## 2024-02-14 DIAGNOSIS — G43109 Migraine with aura, not intractable, without status migrainosus: Secondary | ICD-10-CM

## 2024-02-14 MED ORDER — POLYSACCHARIDE IRON COMPLEX 150 MG PO CAPS: 150.0000 mg | ORAL_CAPSULE | Freq: Every day | ORAL | 0 refills | Status: AC

## 2024-02-14 MED ORDER — HYDROCHLOROTHIAZIDE 25 MG PO TABS: 25.0000 mg | ORAL_TABLET | Freq: Every day | ORAL | 0 refills | Status: AC

## 2024-02-14 NOTE — Assessment & Plan Note (Signed)
 Discussed how this problem influences overall health and the risks it imposes  Reviewed plan for weight loss with lower calorie diet (via better food choices (lower glycemic and portion control) along with exercise building up to or more than 30 minutes 5 days per week including some aerobic activity and strength training   ? If may be candidate for healthy weight and wellness clinic Will d/w pcp  Does want to become pregnant in the future which complicates things

## 2024-02-14 NOTE — Telephone Encounter (Signed)
 Type of form received:  FMLA  Additional comments: NA  Received by: Inocente Moats should be Faxed to: NA  Form should be mailed to:  NA  Is patient requesting call for pickup:  Yes  Form placed:  Provider folder  Attach charge sheet.  Yes  Individual made aware of 3-5 business day turn around (Y/N)? Yes

## 2024-02-14 NOTE — Assessment & Plan Note (Signed)
 Hb 9.5 From menses May add to fatigue and shortness of breath  Intol of ferrous sulfate  in past Will try niferex 150 mg daily and update if side effects  Follow up with pcp next wk

## 2024-02-14 NOTE — Assessment & Plan Note (Signed)
 Intermittent shortness of breath on exertion with atypical cp Recent ER visit reassuring Reviewed hospital records, lab results and studies in detail    Weight, labetelol (? If bronchoconstriction side effects), anemia may all play role

## 2024-02-14 NOTE — Patient Instructions (Addendum)
 Make appointment with obgyn when you can   Talk about the heavy periods Also the blood pressure in the setting of wanting to conceive in the future  For now stop labetelol  Start back hydrochlorothiazide  25 mg daily  Use condoms while you are on this   Lets see if the breathing /chest symptoms improve   Also try the niferex (a different form of iron )    Add some strength training to exercise when you can   Stay well hydrated   Follow up with [facility] in about a week

## 2024-02-14 NOTE — Progress Notes (Signed)
 Subjective:    Patient ID: Alisha Beltran, female    DOB: 1980-06-23, 43 y.o.   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 intermittent shortness of breath And HTN  And migraines    Intermittent heaviness in chest  Hard to catch her breath  Every few days   She started some exercise- walks parking lot  Some days gets shortness of breath and some days not  No wheezing that she knows of   Father - CAD (died in 54s of MI)  MGM- CVA and MI    HTN  No cp or palpitations or headaches or edema  No side effects to medicines  BP Readings from Last 3 Encounters:  02/14/24 (!) 146/82  01/28/24 (!) 182/100  08/08/23 (!) 165/111     Lab Results  Component Value Date   NA 137 01/28/2024   K 3.8 01/28/2024   CO2 23 01/28/2024   GLUCOSE 90 01/28/2024   BUN 8 01/28/2024   CREATININE 0.70 01/28/2024   CALCIUM 8.9 01/28/2024   GFR 72.40 06/12/2023   GFRNONAA >60 01/28/2024     Pmx notable for  Past atypical cp from stress/anxiety  Iron  def anemia  Low b12 Morbid obesity  HTN-on labetalol     Seen in ER on 11/18 for cp Ruled out for MI   DG Chest 2 View Result Date: 01/28/2024 CLINICAL DATA:  Pain under right breast for 24 hours. EXAM: CHEST - 2 VIEW COMPARISON:  June 09, 2017 FINDINGS: The heart size and mediastinal contours are within normal limits. Both lungs are clear. The visualized skeletal structures are unremarkable. IMPRESSION: No active cardiopulmonary disease. Electronically Signed   By: Suzen Dials M.D.   On: 01/28/2024 21:13   Cxr normal  EKG normal   Results for orders placed or performed during the hospital encounter of 01/28/24  Basic metabolic panel   Collection Time:  01/28/24  9:28 PM  Result Value Ref Range   Sodium 137 135 - 145 mmol/L   Potassium 3.8 3.5 - 5.1 mmol/L   Chloride 107 98 - 111 mmol/L   CO2 23 22 - 32 mmol/L   Glucose, Bld 90 70 - 99 mg/dL   BUN 8 6 - 20 mg/dL   Creatinine, Ser 9.29 0.44 - 1.00 mg/dL   Calcium 8.9 8.9 - 89.6 mg/dL   GFR, Estimated >39 >39 mL/min   Anion gap 8 5 - 15  CBC   Collection Time: 01/28/24  9:28 PM  Result Value Ref Range   WBC 8.7 4.0 - 10.5 K/uL   RBC 4.10 3.87 - 5.11 MIL/uL   Hemoglobin 9.5 (L) 12.0 - 15.0 g/dL   HCT 69.0 (L) 63.9 - 53.9 %   MCV 75.4 (L) 80.0 - 100.0 fL   MCH 23.2 (L) 26.0 - 34.0 pg   MCHC 30.7 30.0 - 36.0 g/dL   RDW 83.1 (H) 88.4 - 84.4 %   Platelets 456 (H) 150 - 400 K/uL   nRBC 0.0 0.0 - 0.2 %  hCG, serum, qualitative   Collection Time: 01/28/24  9:28 PM  Result Value Ref Range  Preg, Serum NEGATIVE NEGATIVE  Troponin T, High Sensitivity   Collection Time: 01/28/24  9:28 PM  Result Value Ref Range   Troponin T High Sensitivity <15 0 - 19 ng/L     Patient Active Problem List   Diagnosis Date Noted   SOB (shortness of breath) on exertion 02/14/2024   Other fatigue 06/12/2023   Witnessed episode of apnea 06/12/2023   Tinnitus of left ear 06/12/2023   Abnormal TSH 08/03/2022   Migraine with aura and without status migrainosus, not intractable 08/03/2022   Preventative health care 03/21/2022   Lower abdominal pain 12/25/2021   Low serum vitamin B12 12/25/2021   Iron  deficiency anemia 12/25/2021   Acute left ankle pain 07/10/2021   Blood in stool 07/10/2021   Atypical chest pain 07/10/2021   Abnormal CBC 07/10/2021   Anxiety and depression 07/10/2021   Vitamin D  deficiency 07/10/2021   Chronic idiopathic constipation 12/30/2019   Seasonal allergic rhinitis 12/30/2019   Morbid obesity with BMI of 50.0-59.9, adult (HCC) 08/14/2017   Essential hypertension 08/14/2017   Morbid obesity (HCC) 08/14/2017   Past Medical History:  Diagnosis Date   Allergy Unknown    Pet Dander, Dust   Anxiety Unknown   GERD (gastroesophageal reflux disease) Unknown   Hypertension Unknown   Past Surgical History:  Procedure Laterality Date   NO PAST SURGERIES     Social History   Tobacco Use   Smoking status: Never   Smokeless tobacco: Never  Vaping Use   Vaping status: Never Used  Substance Use Topics   Alcohol use: No   Drug use: Never   Family History  Problem Relation Age of Onset   Cancer Mother        lung   Early death Mother 32   Anxiety disorder Mother    Heart attack Father    Hypertension Father    Allergies  Allergen Reactions   Other Hives and Itching    Pet Dander   Amlodipine  Other (See Comments)    Dizziness   Dust Mite Extract     Coughing    Iron  Other (See Comments)    Hot flashes.   Sertraline  Hcl Other (See Comments)    Felt out of it. Lethargic and felt more emotional.   No current outpatient medications on file prior to visit.   No current facility-administered medications on file prior to visit.    Review of Systems  Constitutional:  Positive for fatigue.  Respiratory:  Positive for shortness of breath. Negative for cough.   Cardiovascular:  Negative for palpitations and leg swelling.       Occational chest heaviness/not pain   Gastrointestinal:  Negative for abdominal distention, abdominal pain and blood in stool.  Genitourinary:  Positive for menstrual problem.  Neurological:  Positive for headaches.  Psychiatric/Behavioral:         Stress       Objective:   Physical Exam Constitutional:      General: She is not in acute distress.    Appearance: She is well-developed. She is obese. She is not ill-appearing or diaphoretic.  HENT:     Head: Normocephalic and atraumatic.     Mouth/Throat:     Mouth: Mucous membranes are moist.  Eyes:     Conjunctiva/sclera: Conjunctivae normal.     Pupils: Pupils are equal, round, and reactive to light.  Neck:     Thyroid : No thyromegaly.     Vascular: No carotid  bruit or JVD.  Cardiovascular:  Rate and Rhythm: Normal rate and regular rhythm.     Heart sounds: Normal heart sounds.     No gallop.  Pulmonary:     Effort: Pulmonary effort is normal. No respiratory distress.     Breath sounds: Normal breath sounds. No stridor. No wheezing, rhonchi or rales.     Comments: Good air exch No shortness of breath with speech Chest:     Chest wall: No tenderness.  Abdominal:     General: There is no distension or abdominal bruit.     Palpations: Abdomen is soft. There is no mass.     Tenderness: There is no abdominal tenderness. There is no guarding.  Musculoskeletal:     Cervical back: Normal range of motion and neck supple.     Right lower leg: No edema.     Left lower leg: No edema.     Comments: No LE tenderness  Lymphadenopathy:     Cervical: No cervical adenopathy.  Skin:    General: Skin is warm and dry.     Coloration: Skin is not pale.     Findings: No rash.  Neurological:     Mental Status: She is alert.     Cranial Nerves: No cranial nerve deficit.     Coordination: Coordination normal.     Gait: Gait normal.     Deep Tendon Reflexes: Reflexes are normal and symmetric. Reflexes normal.  Psychiatric:        Mood and Affect: Mood normal.           Assessment & Plan:   Problem List Items Addressed This Visit       Cardiovascular and Mediastinum   Migraine with aura and without status migrainosus, not intractable   Worse lately  May need re eval  Will try and get blood pressure down and correct anemia Follow up with pcp to discuss treatment options        Relevant Medications   hydrochlorothiazide  (HYDRODIURIL ) 25 MG tablet   Essential hypertension - Primary   Blood pressure is not adequately controlled on labetelol Also wondering if this is causing some shortness of breath/chest symptoms   Wants to change back to hydrochlorothiazide  (and take a break trying to conceive until health is better)    Hydrochlorothiazide  25 mg daily  Ins to use contraception on this   Follow up with pcp in a week for visit and labs       Relevant Medications   hydrochlorothiazide  (HYDRODIURIL ) 25 MG tablet     Other   SOB (shortness of breath) on exertion   Intermittent shortness of breath on exertion with atypical cp Recent ER visit reassuring Reviewed hospital records, lab results and studies in detail    Weight, labetelol (? If bronchoconstriction side effects), anemia may all play role       Morbid obesity with BMI of 50.0-59.9, adult (HCC)   Discussed how this problem influences overall health and the risks it imposes  Reviewed plan for weight loss with lower calorie diet (via better food choices (lower glycemic and portion control) along with exercise building up to or more than 30 minutes 5 days per week including some aerobic activity and strength training   ? If may be candidate for healthy weight and wellness clinic Will d/w pcp  Does want to become pregnant in the future which complicates things       Iron  deficiency anemia   Hb 9.5 From menses May add to fatigue and shortness  of breath  Intol of ferrous sulfate  in past Will try niferex 150 mg daily and update if side effects  Follow up with pcp next wk       Relevant Medications   iron  polysaccharides (NIFEREX) 150 MG capsule

## 2024-02-14 NOTE — Assessment & Plan Note (Signed)
 Worse lately  May need re eval  Will try and get blood pressure down and correct anemia Follow up with pcp to discuss treatment options

## 2024-02-14 NOTE — Assessment & Plan Note (Signed)
 Blood pressure is not adequately controlled on labetelol Also wondering if this is causing some shortness of breath/chest symptoms   Wants to change back to hydrochlorothiazide  (and take a break trying to conceive until health is better)   Hydrochlorothiazide  25 mg daily  Ins to use contraception on this   Follow up with pcp in a week for visit and labs

## 2024-02-17 NOTE — Telephone Encounter (Addendum)
 Left message to return call to office, also left MyChart message.  Need to follow up regarding FMLA/disability forms.

## 2024-02-18 NOTE — Telephone Encounter (Signed)
 Left second VM message to return call to office. Need to follow up regarding FMLA/disability forms.

## 2024-02-19 NOTE — Telephone Encounter (Signed)
 Left third message via MyChart for patient to return call to office. Need to follow up regarding FMLA/disability forms. I informed the patient that if the forms were no longer needed, to please let me know, and that the forms will be put on hold until we hear back.

## 2024-02-21 ENCOUNTER — Ambulatory Visit: Payer: Self-pay | Admitting: Nurse Practitioner

## 2024-02-24 NOTE — Telephone Encounter (Signed)
 FMLA forms received and needed questions answered by patient via MyChart  Forms will need to be picked up by patient and given to employer as no fax number was given to send completed forms to.  Will reach out to patient when completed forms are received from provider.  Forms placed in providers box for review.

## 2024-02-26 NOTE — Telephone Encounter (Signed)
 Completed forms received  Copy sent to scan  Copy left at front desk for patient to give to employer.   MyChart message left for patient letting her know.

## 2024-02-26 NOTE — Telephone Encounter (Signed)
 Forms have been completed and placed on your desk

## 2024-03-06 NOTE — Telephone Encounter (Signed)
 Patient arrived in office to pick up ppwk

## 2024-03-20 ENCOUNTER — Ambulatory Visit: Payer: Self-pay | Admitting: Nurse Practitioner

## 2024-04-01 ENCOUNTER — Ambulatory Visit: Payer: Self-pay | Admitting: Nurse Practitioner

## 2024-04-13 ENCOUNTER — Telehealth: Admitting: Emergency Medicine

## 2024-04-13 ENCOUNTER — Encounter: Admitting: Family Medicine

## 2024-04-13 DIAGNOSIS — B9689 Other specified bacterial agents as the cause of diseases classified elsewhere: Secondary | ICD-10-CM | POA: Diagnosis not present

## 2024-04-13 DIAGNOSIS — J019 Acute sinusitis, unspecified: Secondary | ICD-10-CM

## 2024-04-13 DIAGNOSIS — G43109 Migraine with aura, not intractable, without status migrainosus: Secondary | ICD-10-CM

## 2024-04-13 MED ORDER — ONDANSETRON HCL 4 MG PO TABS: 4.0000 mg | ORAL_TABLET | Freq: Three times a day (TID) | ORAL | 0 refills | Status: AC | PRN

## 2024-04-13 MED ORDER — AMOXICILLIN-POT CLAVULANATE 875-125 MG PO TABS: 1.0000 | ORAL_TABLET | Freq: Two times a day (BID) | ORAL | 0 refills | Status: AC

## 2024-04-13 NOTE — Progress Notes (Signed)
 " Virtual Visit Consent   Alisha Beltran, you are scheduled for a virtual visit with a [facility] provider today. Just as with appointments in the office, your consent must be obtained to participate. Your consent will be active for this visit and any virtual visit you may have with one of our providers in the next 365 days. If you have a MyChart account, a copy of this consent can be sent to you electronically.  As this is a virtual visit, video technology does not allow for your provider to perform a traditional examination. This may limit your provider's ability to fully assess your condition. If your provider identifies any concerns that need to be evaluated in person or the need to arrange testing (such as labs, EKG, etc.), we will make arrangements to do so. Although advances in technology are sophisticated, we cannot ensure that it will always work on either your end or our end. If the connection with a video visit is poor, the visit may have to be switched to a telephone visit. With either a video or telephone visit, we are not always able to ensure that we have a secure connection.  By engaging in this virtual visit, you consent to the provision of healthcare and authorize for your insurance to be billed (if applicable) for the services provided during this visit. Depending on your insurance coverage, you may receive a charge related to this service.  I need to obtain your verbal consent now. Are you willing to proceed with your visit today? Alisha Beltran has provided verbal consent on 04/13/2024 for a virtual visit (video or telephone). Jon CHRISTELLA Belt, NP  Date: 04/13/2024 11:29 AM   Virtual Visit via Video Note   I, Jon CHRISTELLA Belt, connected with  Alisha Beltran  ([id number], 03/14/1980) on 04/13/24 at 11:15 AM EST by a video-enabled telemedicine application and verified that I am speaking with the correct person using two identifiers.  Location: Patient: Virtual Visit Location Patient:  Home Provider: Virtual Visit Location Provider: Home Office   I discussed the limitations of evaluation and management by telemedicine and the availability of in person appointments. The patient expressed understanding and agreed to proceed.    History of Present Illness: Alisha Beltran is a 44 y.o. who identifies as a female who was assigned female at birth, and is being seen today for migraine and sinus pressure.   About a week ago, had R nostril congestion, L nostril was fine. Then post nasal drainage started. Now feels like she has a migraine, has throbbing behind the R eye - this is typical migraine sx for her. Can see, no change in vision. Has a lot of pressure/pain in R max sinus, no other sinuses with sx. Denies R teeth pain.   Taking benadryl, using triamcinolone  nasal spray, [facility] daytime allergy medicine.   For migraines, usually can rest and take OTC migraine medicine (can't remember name but it's something like excedrin migraine).   No fever, chills. No SOB.   HPI: HPI  Problems:  Patient Active Problem List   Diagnosis Date Noted   SOB (shortness of breath) on exertion 02/14/2024   Other fatigue 06/12/2023   Witnessed episode of apnea 06/12/2023   Tinnitus of left ear 06/12/2023   Abnormal TSH 08/03/2022   Migraine with aura and without status migrainosus, not intractable 08/03/2022   Preventative health care 03/21/2022   Lower abdominal pain 12/25/2021   Low serum vitamin B12 12/25/2021   Iron  deficiency anemia 12/25/2021  Acute left ankle pain 07/10/2021   Blood in stool 07/10/2021   Atypical chest pain 07/10/2021   Abnormal CBC 07/10/2021   Anxiety and depression 07/10/2021   Vitamin D  deficiency 07/10/2021   Chronic idiopathic constipation 12/30/2019   Seasonal allergic rhinitis 12/30/2019   Morbid obesity with BMI of 50.0-59.9, adult (HCC) 08/14/2017   Essential hypertension 08/14/2017   Morbid obesity (HCC) 08/14/2017    Allergies:  Allergies[1] Medications: Current Medications[2]  Observations/Objective: Patient is well-developed, well-nourished in no acute distress.  Resting comfortably  at home.  Head is normocephalic, atraumatic.  No labored breathing.  Speech is clear and coherent with logical content.  Patient is alert and oriented at baseline.    Assessment and Plan: 1. Acute bacterial sinusitis (Primary) - amoxicillin -clavulanate (AUGMENTIN ) 875-125 MG tablet; Take 1 tablet by mouth 2 (two) times daily.  Dispense: 14 tablet; Refill: 0  2. Migraine with aura and without status migrainosus, not intractable - ondansetron  (ZOFRAN ) 4 MG tablet; Take 1 tablet (4 mg total) by mouth every 8 (eight) hours as needed for nausea or vomiting.  Dispense: 20 tablet; Refill: 0  Although she does not have nausea with migraines, I find it sometimes helps - she will try zofran  along with her otc migraine medicine.   She will add mucinex  and start saline irrigatation. Continue triamcinolone  nose spray and allergy medicine.   Follow Up Instructions: I discussed the assessment and treatment plan with the patient. The patient was provided an opportunity to ask questions and all were answered. The patient agreed with the plan and demonstrated an understanding of the instructions.  A copy of instructions were sent to the patient via MyChart unless otherwise noted below.   The patient was advised to call back or seek an in-person evaluation if the symptoms worsen or if the condition fails to improve as anticipated.    Jon CHRISTELLA Belt, NP    [1]  Allergies Allergen Reactions   Other Hives and Itching    Pet Dander   Amlodipine  Other (See Comments)    Dizziness   Dust Mite Extract     Coughing    Iron  Other (See Comments)    Hot flashes.   Sertraline  Hcl Other (See Comments)    Felt out of it. Lethargic and felt more emotional.  [2]  Current Outpatient Medications:    amoxicillin -clavulanate (AUGMENTIN ) 875-125 MG  tablet, Take 1 tablet by mouth 2 (two) times daily., Disp: 14 tablet, Rfl: 0   ondansetron  (ZOFRAN ) 4 MG tablet, Take 1 tablet (4 mg total) by mouth every 8 (eight) hours as needed for nausea or vomiting., Disp: 20 tablet, Rfl: 0   hydrochlorothiazide  (HYDRODIURIL ) 25 MG tablet, Take 1 tablet (25 mg total) by mouth daily., Disp: 30 tablet, Rfl: 0   iron  polysaccharides (NIFEREX) 150 MG capsule, Take 1 capsule (150 mg total) by mouth daily., Disp: 30 capsule, Rfl: 0  "

## 2024-04-13 NOTE — Patient Instructions (Signed)
 " [employer], thank you for joining Jon CHRISTELLA Belt, NP for today's virtual visit.  While this provider is not your primary care provider (PCP), if your PCP is located in our provider database this encounter information will be shared with them immediately following your visit.   A [facility] MyChart account gives you access to today's visit and all your visits, tests, and labs performed at [facility]  click here if you don't have a [facility] MyChart account or go to [email or web address]  Consent: (Patient) Alisha Beltran provided verbal consent for this virtual visit at the beginning of the encounter.  Current Medications:  Current Outpatient Medications:    amoxicillin -clavulanate (AUGMENTIN ) 875-125 MG tablet, Take 1 tablet by mouth 2 (two) times daily., Disp: 14 tablet, Rfl: 0   ondansetron  (ZOFRAN ) 4 MG tablet, Take 1 tablet (4 mg total) by mouth every 8 (eight) hours as needed for nausea or vomiting., Disp: 20 tablet, Rfl: 0   hydrochlorothiazide  (HYDRODIURIL ) 25 MG tablet, Take 1 tablet (25 mg total) by mouth daily., Disp: 30 tablet, Rfl: 0   iron  polysaccharides (NIFEREX) 150 MG capsule, Take 1 capsule (150 mg total) by mouth daily., Disp: 30 capsule, Rfl: 0   Medications ordered in this encounter:  Meds ordered this encounter  Medications   ondansetron  (ZOFRAN ) 4 MG tablet    Sig: Take 1 tablet (4 mg total) by mouth every 8 (eight) hours as needed for nausea or vomiting.    Dispense:  20 tablet    Refill:  0   amoxicillin -clavulanate (AUGMENTIN ) 875-125 MG tablet    Sig: Take 1 tablet by mouth 2 (two) times daily.    Dispense:  14 tablet    Refill:  0     *If you need refills on other medications prior to your next appointment, please contact your pharmacy*  Follow-Up: Call back or seek an in-person evaluation if the symptoms worsen or if the condition fails to improve as anticipated.     Other Instructions  Ok to continue using [facility] nose spray.  Add mucinex  - ok to use store brand guaifenesin .   Also restart using neti pot. Use saline irrigation, such as with a neti pot, several times a day while you are sick. Many neti pots come with salt packets premeasured to use to make saline. If you use your own salt, make sure it is kosher salt or sea salt (don't use table salt as it has iodine in it and you don't need that in your nose). Use distilled water to make saline. If you mix your own saline using your own salt, the recipe is 1/4 teaspoon salt in 1 cup warm water. Using saline irrigation can help prevent and treat sinus infections.   Take your migraine medicine and also the nausea medicine - ondanestron - and then rest to relieve your migraine. I think you may still have some headache until your sinus infection is better but hopeful not a migraine after this treatment.    If you have been instructed to have an in-person evaluation today at a local Urgent Care facility, please use the link below. It will take you to a list of all of our available [facility], including address, phone number and hours of operation. Please do not delay care.  [facility]  If you or a family member do not have a primary care provider, use the link below to schedule a visit and establish care. When you choose a  [facility] primary care physician or advanced practice provider, you gain a long-term partner in health. Find a Primary Care Provider  Learn more about [facility]'s in-office and virtual care options: [facility] - Get Care Now  "
